# Patient Record
Sex: Female | Born: 1965 | Race: White | Hispanic: No | Marital: Married | Smoking: Never smoker
Health system: Southern US, Community
[De-identification: ages and names within clinical notes are randomized; demographics above are authoritative.]

## PROBLEM LIST (undated history)

## (undated) DIAGNOSIS — S42022A Displaced fracture of shaft of left clavicle, initial encounter for closed fracture: Secondary | ICD-10-CM

## (undated) DIAGNOSIS — H04123 Dry eye syndrome of bilateral lacrimal glands: Secondary | ICD-10-CM

## (undated) DIAGNOSIS — D649 Anemia, unspecified: Secondary | ICD-10-CM

## (undated) DIAGNOSIS — I499 Cardiac arrhythmia, unspecified: Secondary | ICD-10-CM

## (undated) DIAGNOSIS — K219 Gastro-esophageal reflux disease without esophagitis: Secondary | ICD-10-CM

## (undated) DIAGNOSIS — E039 Hypothyroidism, unspecified: Secondary | ICD-10-CM

## (undated) DIAGNOSIS — R51 Headache: Secondary | ICD-10-CM

## (undated) DIAGNOSIS — R519 Headache, unspecified: Secondary | ICD-10-CM

## (undated) DIAGNOSIS — K922 Gastrointestinal hemorrhage, unspecified: Secondary | ICD-10-CM

## (undated) DIAGNOSIS — N879 Dysplasia of cervix uteri, unspecified: Secondary | ICD-10-CM

## (undated) HISTORY — PX: REFRACTIVE SURGERY: SHX103

## (undated) HISTORY — DX: Dysplasia of cervix uteri, unspecified: N87.9

## (undated) HISTORY — PX: LIPOSUCTION: SHX10

## (undated) HISTORY — PX: COSMETIC SURGERY: SHX468

## (undated) HISTORY — PX: CATARACT EXTRACTION: SUR2

## (undated) HISTORY — PX: UPPER GI ENDOSCOPY: SHX6162

---

## 1990-08-04 HISTORY — PX: CERVICAL BIOPSY  W/ LOOP ELECTRODE EXCISION: SUR135

## 1998-11-13 ENCOUNTER — Other Ambulatory Visit: Admission: RE | Admit: 1998-11-13 | Discharge: 1998-11-13 | Payer: Self-pay | Admitting: Gynecology

## 2001-01-14 ENCOUNTER — Other Ambulatory Visit: Admission: RE | Admit: 2001-01-14 | Discharge: 2001-01-14 | Payer: Self-pay | Admitting: Gynecology

## 2002-03-01 ENCOUNTER — Other Ambulatory Visit: Admission: RE | Admit: 2002-03-01 | Discharge: 2002-03-01 | Payer: Self-pay | Admitting: Gynecology

## 2002-09-05 ENCOUNTER — Ambulatory Visit (HOSPITAL_COMMUNITY): Admission: RE | Admit: 2002-09-05 | Discharge: 2002-09-05 | Payer: Self-pay | Admitting: Orthopedic Surgery

## 2002-09-05 ENCOUNTER — Encounter: Payer: Self-pay | Admitting: Orthopedic Surgery

## 2003-02-14 ENCOUNTER — Other Ambulatory Visit: Admission: RE | Admit: 2003-02-14 | Discharge: 2003-02-14 | Payer: Self-pay | Admitting: Gynecology

## 2003-09-08 ENCOUNTER — Encounter: Admission: RE | Admit: 2003-09-08 | Discharge: 2003-09-08 | Payer: Self-pay | Admitting: Internal Medicine

## 2003-12-08 ENCOUNTER — Ambulatory Visit (HOSPITAL_COMMUNITY): Admission: RE | Admit: 2003-12-08 | Discharge: 2003-12-08 | Payer: Self-pay | Admitting: Gynecology

## 2005-04-15 ENCOUNTER — Other Ambulatory Visit: Admission: RE | Admit: 2005-04-15 | Discharge: 2005-04-15 | Payer: Self-pay | Admitting: Gynecology

## 2005-10-16 ENCOUNTER — Other Ambulatory Visit: Admission: RE | Admit: 2005-10-16 | Discharge: 2005-10-16 | Payer: Self-pay | Admitting: Gynecology

## 2006-02-13 ENCOUNTER — Encounter: Admission: RE | Admit: 2006-02-13 | Discharge: 2006-02-13 | Payer: Self-pay | Admitting: Family Medicine

## 2006-02-20 ENCOUNTER — Encounter: Admission: RE | Admit: 2006-02-20 | Discharge: 2006-02-20 | Payer: Self-pay | Admitting: Internal Medicine

## 2006-04-01 ENCOUNTER — Encounter: Admission: RE | Admit: 2006-04-01 | Discharge: 2006-04-01 | Payer: Self-pay | Admitting: Internal Medicine

## 2006-04-29 ENCOUNTER — Other Ambulatory Visit: Admission: RE | Admit: 2006-04-29 | Discharge: 2006-04-29 | Payer: Self-pay | Admitting: Gynecology

## 2007-05-12 ENCOUNTER — Other Ambulatory Visit: Admission: RE | Admit: 2007-05-12 | Discharge: 2007-05-12 | Payer: Self-pay | Admitting: Gynecology

## 2007-06-10 ENCOUNTER — Encounter: Payer: Self-pay | Admitting: Gynecology

## 2007-06-10 ENCOUNTER — Ambulatory Visit (HOSPITAL_BASED_OUTPATIENT_CLINIC_OR_DEPARTMENT_OTHER): Admission: RE | Admit: 2007-06-10 | Discharge: 2007-06-10 | Payer: Self-pay | Admitting: Gynecology

## 2008-06-02 ENCOUNTER — Ambulatory Visit: Payer: Self-pay | Admitting: Gynecology

## 2008-06-02 ENCOUNTER — Encounter: Payer: Self-pay | Admitting: Gynecology

## 2008-06-02 ENCOUNTER — Other Ambulatory Visit: Admission: RE | Admit: 2008-06-02 | Discharge: 2008-06-02 | Payer: Self-pay | Admitting: Gynecology

## 2009-06-18 ENCOUNTER — Other Ambulatory Visit: Admission: RE | Admit: 2009-06-18 | Discharge: 2009-06-18 | Payer: Self-pay | Admitting: Gynecology

## 2009-06-18 ENCOUNTER — Encounter: Payer: Self-pay | Admitting: Gynecology

## 2009-06-18 ENCOUNTER — Ambulatory Visit: Payer: Self-pay | Admitting: Gynecology

## 2009-07-17 ENCOUNTER — Ambulatory Visit: Payer: Self-pay | Admitting: Gynecology

## 2009-08-17 ENCOUNTER — Ambulatory Visit: Payer: Self-pay | Admitting: Gynecology

## 2009-08-23 ENCOUNTER — Ambulatory Visit (HOSPITAL_BASED_OUTPATIENT_CLINIC_OR_DEPARTMENT_OTHER): Admission: RE | Admit: 2009-08-23 | Discharge: 2009-08-23 | Payer: Self-pay | Admitting: Gynecology

## 2009-08-23 ENCOUNTER — Ambulatory Visit: Payer: Self-pay | Admitting: Gynecology

## 2009-09-06 ENCOUNTER — Ambulatory Visit: Payer: Self-pay | Admitting: Gynecology

## 2010-06-26 ENCOUNTER — Ambulatory Visit: Payer: Self-pay | Admitting: Gynecology

## 2010-06-26 ENCOUNTER — Other Ambulatory Visit: Admission: RE | Admit: 2010-06-26 | Discharge: 2010-06-26 | Payer: Self-pay | Admitting: Gynecology

## 2010-12-17 NOTE — H&P (Signed)
NAME:  Brittany Moss, Brittany Moss               ACCOUNT NO.:  [id number]   MEDICAL RECORD NO.:  [id number]          PATIENT TYPE:  AMB   LOCATION:  [facility]                         FACILITY:  [facility]   PHYSICIAN:  Timothy P. Fontaine, M.D.DATE OF BIRTH:  Sep 26, 1965   DATE OF ADMISSION:  06/10/2007  DATE OF DISCHARGE:                              HISTORY & PHYSICAL   CHIEF COMPLAINT:  Menorrhagia, endometrial polyps.   HISTORY OF PRESENT ILLNESS:  A 45 year old G3, P0, AB3 female that  presented for evaluation of menorrhagia.  Sonohystogram performed shows  two polypoid endometrial defects.  She is admitted at this time for  hysteroscopy, polypectomy, D&C.   PAST MEDICAL HISTORY:  Significant for  1. Depression.  2. Anxiety.  3. Hypothyroidism.   CURRENT MEDICATIONS:  1. Zoloft 50 mg daily.  2. Synthroid, dose per medicine reconciliation sheet.  3. Multivitamin.   PAST SURGICAL HISTORY:  Liposuction.   ALLERGIES:  No medications.   REVIEW OF SYSTEMS:  Noncontributory.   FAMILY HISTORY:  Noncontributory.   SOCIAL HISTORY:  Noncontributory.   PHYSICAL EXAMINATION ON ADMISSION:  VITAL SIGNS:  Afebrile, vital signs  are stable.  HEENT:  Normal.  LUNGS:  Clear.  CARDIAC:  Regular rate.  No rubs, murmurs or gallops.  ABDOMINAL:  Exam benign.  PELVIC:  External BUS, vagina normal.  Cervix normal.  Uterus normal  size, midline and mobile, nontender.  Adnexa without masses or  tenderness.   ASSESSMENT:  A 45 year old with menorrhagia.  Sonohystogram shows  endometrial defects consistent with polyps for hysteroscopy dilatation  and curettage, polypectomy.  I reviewed the proposed surgery with her  and her husband, expected intraoperative and postoperative courses, the  instrumentation, use of the resectoscope, distended medium, dilatation  and curettage.  The acute risks of the procedure were reviewed to  include infection, prolonged antibiotics, hemorrhage necessitating  transfusion and  risks of transfusion, uterine perforation, including  damage to internal organs, bowel, bladder, ureters, vessels and nerves  necessitating major exploratory reparative surgeries, future reparative  surgeries, bowel resection, ostomy formation was all discussed,  understood and accepted.  The possible pathology results were discussed  and various scenarios reviewed.  She understands there are no guarantees  as far as menorrhagia relief, her menorrhagia may continue to worsen or  change following the procedure, all of which she understands and  accepts.  The patient's questions were answered to her satisfaction.  She is ready to proceed with surgery.      Timothy P. Fontaine, M.D.  Electronically Signed     TPF/MEDQ  D:  06/04/2007  T:  06/06/2007  Job:  [id number]

## 2010-12-17 NOTE — Op Note (Signed)
NAME:  Brittany Moss, Brittany Moss               ACCOUNT NO.:  [id number]   MEDICAL RECORD NO.:  [id number]          PATIENT TYPE:  AMB   LOCATION:  [facility]                         FACILITY:  [facility]   PHYSICIAN:  Timothy P. Fontaine, M.D.DATE OF BIRTH:  01-10-1966   DATE OF PROCEDURE:  06/10/2007  DATE OF DISCHARGE:                               OPERATIVE REPORT   PREOPERATIVE DIAGNOSES:  Menorrhagia, endometrial polyps.   POSTOPERATIVE DIAGNOSES:  Menorrhagia, endometrial polyps.   PROCEDURE:  Hysteroscopy D&C.   SURGEON:  Timothy P. Fontaine, M.D.   ANESTHETIC:  General with 1% lidocaine paracervical block.   SPECIMEN:  Endometrial curetting.   COMPLICATIONS:  None   ESTIMATED BLOOD LOSS:  Minimal.   SORBITOL DISCREPANCY:  50 mL.   FINDINGS:  EUA:  External BUS, vagina normal.  Cervix normal.  Uterus  normal size, anteverted, midline and mobile.  Adnexa without masses.  Hysteroscopic with multiple polypoid fragments initially obscuring  cavity.  Status post D&C cavity was normal with adequate hysteroscopy.  No remaining polypoid fragments.   PROCEDURE:  The patient was taken to the operating room, underwent  general anesthesia, was placed in the dorsal lithotomy position,  received a perineal vaginal preparation with Betadine solution.  Bladder  emptied with in-and-out Foley catheterization.  EUA performed.  The  patient was draped in usual fashion.  Cervix was visualized with a  speculum.  Single-toothed tenaculum anterior lip stabilization.  Paracervical block using 1% lidocaine was placed, a total of 10 mL.  The  cervix was gently dilated to admit the operative hysteroscope, and  hysteroscopy was performed.  There was multiple polypoid fragments  filling the cavity, and a sharp curettage was performed to empty the  cavity.  Rehysteroscopy showed an empty cavity.  No remaining polypoid  areas, and the hysteroscopy was noted to be adequate and normal with  good distention, no evidence  of perforation.  The instruments were then  all removed.  The cervix showed adequate hemostasis.  Speculum removed.  The patient placed in the supine position, awakened without difficulty  and taken to recovery room in good condition, having tolerated the  procedure well.     Timothy P. Fontaine, M.D.  Electronically Signed    TPF/MEDQ  D:  06/10/2007  T:  06/10/2007  Job:  [id number]

## 2011-06-06 ENCOUNTER — Encounter: Payer: Self-pay | Admitting: Gynecology

## 2012-11-22 ENCOUNTER — Encounter: Payer: Self-pay | Admitting: Gynecology

## 2013-02-17 ENCOUNTER — Other Ambulatory Visit (HOSPITAL_COMMUNITY)
Admission: RE | Admit: 2013-02-17 | Discharge: 2013-02-17 | Disposition: A | Discharge status: Home / Self Care | Payer: 59 | Source: Ambulatory Visit | Attending: Gynecology | Admitting: Gynecology

## 2013-02-17 ENCOUNTER — Ambulatory Visit (INDEPENDENT_AMBULATORY_CARE_PROVIDER_SITE_OTHER): Payer: Managed Care, Other (non HMO) | Admitting: Gynecology

## 2013-02-17 ENCOUNTER — Encounter: Payer: Self-pay | Admitting: Gynecology

## 2013-02-17 VITALS — BP 114/68 | Ht 64.0 in | Wt 144.0 lb

## 2013-02-17 DIAGNOSIS — Z01419 Encounter for gynecological examination (general) (routine) without abnormal findings: Secondary | ICD-10-CM | POA: Insufficient documentation

## 2013-02-17 DIAGNOSIS — Z1151 Encounter for screening for human papillomavirus (HPV): Secondary | ICD-10-CM | POA: Insufficient documentation

## 2013-02-17 DIAGNOSIS — N926 Irregular menstruation, unspecified: Secondary | ICD-10-CM

## 2013-02-17 DIAGNOSIS — N951 Menopausal and female climacteric states: Secondary | ICD-10-CM

## 2013-02-17 NOTE — Patient Instructions (Signed)
Follow up for ultrasound as scheduled 

## 2013-02-17 NOTE — Progress Notes (Signed)
Fajr Marut 1966/07/14 [id number]        47 y.o.  G3P0030 for annual exam.  Has not been in for several years. Several issues noted below.  Past medical history,surgical history, medications, allergies, family history and social history were all reviewed and documented in the EPIC chart.  ROS:  Performed and pertinent positives and negatives are included in the history, assessment and plan .  Exam: Kim assistant Filed Vitals:   02/17/13 1607  BP: 114/68  Height: 5\' 4"  (1.626 m)  Weight: 144 lb (65.318 kg)   General appearance  Normal Skin grossly normal Head/Neck normal with no cervical or supraclavicular adenopathy thyroid normal Lungs  clear Cardiac RR, without RMG Abdominal  soft, nontender, without masses, organomegaly or hernia Breasts  examined lying and sitting without masses, retractions, discharge or axillary adenopathy. Pelvic  Ext/BUS/vagina  normal   Cervix  normal   Uterus  anteverted, normal size, shape and contour, midline and mobile nontender   Adnexa  Without masses or tenderness    Anus and perineum  normal   Rectovaginal  normal sphincter tone without palpated masses or tenderness.    Assessment/Plan:  47 y.o. G65P0030 female for annual exam.   1. Irregular menses/menopausal symptoms. Long history of irregular menses. Evaluation in 2011 showed FSH of 81. She continues to have menses anywhere from once a month to skip up to 50 days. She is having night sweats as well as memory issues. We will recheck FSH TSH today. Recommend sonohysterogram due to the irregular bleeding history and past history of endometrial polyps. Discussed options to include HRT versus low-dose oral contraceptives. She still is having menses low dose oral contraceptives may be a better choice for menstrual regulation and addressing the low estrogen level. Every other to every third month withdrawal options also reviewed. I reviewed the whole issue of HRT to include the WHI study with increased  risk of stroke heart attack DVT and breast cancer. Increased risk of thrombosis with oral contraceptives also discussed particularly with advancing age. She's not been followed for any vascular disease such as diabetes or hypertension and she has never smoked. Will rediscuss after lab work and ultrasound. 2. Pap smear 2011. Pap/HPV done today. History of LEEP 1995. Those records are off site but she has normal Pap smears since then through 2011. Assuming this Pap smear returns normal in plan continued screening at a less frequent interval per current screening guidelines. 3. Mammography 2014. Continue with annual mammography. SBE monthly reviewed. 4. Health maintenance. Patient has all of her routine blood work done through her primary physician's office. Will followup for her hormone levels as well as sonohysterogram and then we'll go from there.  Note: This document was prepared with digital dictation and possible smart phrase technology. Any transcriptional errors that result from this process are unintentional.   Dara Lords MD, 4:40 PM 02/17/2013

## 2013-02-18 LAB — FOLLICLE STIMULATING HORMONE: FSH: 28.2 m[IU]/mL

## 2013-03-01 ENCOUNTER — Other Ambulatory Visit: Payer: Self-pay | Admitting: Gynecology

## 2013-03-01 DIAGNOSIS — N926 Irregular menstruation, unspecified: Secondary | ICD-10-CM

## 2013-03-02 ENCOUNTER — Encounter: Payer: Self-pay | Admitting: Gynecology

## 2013-03-02 ENCOUNTER — Ambulatory Visit (INDEPENDENT_AMBULATORY_CARE_PROVIDER_SITE_OTHER): Payer: Managed Care, Other (non HMO) | Admitting: Gynecology

## 2013-03-02 ENCOUNTER — Ambulatory Visit (INDEPENDENT_AMBULATORY_CARE_PROVIDER_SITE_OTHER): Payer: Managed Care, Other (non HMO)

## 2013-03-02 DIAGNOSIS — Q51810 Arcuate uterus: Secondary | ICD-10-CM

## 2013-03-02 DIAGNOSIS — N831 Corpus luteum cyst of ovary, unspecified side: Secondary | ICD-10-CM

## 2013-03-02 DIAGNOSIS — N920 Excessive and frequent menstruation with regular cycle: Secondary | ICD-10-CM

## 2013-03-02 DIAGNOSIS — N83209 Unspecified ovarian cyst, unspecified side: Secondary | ICD-10-CM

## 2013-03-02 DIAGNOSIS — D251 Intramural leiomyoma of uterus: Secondary | ICD-10-CM

## 2013-03-02 DIAGNOSIS — N926 Irregular menstruation, unspecified: Secondary | ICD-10-CM

## 2013-03-02 DIAGNOSIS — D259 Leiomyoma of uterus, unspecified: Secondary | ICD-10-CM

## 2013-03-02 DIAGNOSIS — N839 Noninflammatory disorder of ovary, fallopian tube and broad ligament, unspecified: Secondary | ICD-10-CM

## 2013-03-02 MED ORDER — NORETHINDRONE ACET-ETHINYL EST 1-20 MG-MCG PO TABS: 1.0000 | ORAL_TABLET | Freq: Every day | ORAL | Status: DC

## 2013-03-02 NOTE — Patient Instructions (Signed)
Start on birth control pills as we discussed. Followup for repeat ultrasound in 3 months.

## 2013-03-02 NOTE — Progress Notes (Signed)
Patient presents for sonohysterogram due to irregular bleeding. FSH returned to 28. TSH was mildly elevated also.  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 Endometrial echo 5.9 mm. Right and left ovaries with cystic changes consistent with physiologic change. There is some debris in the left ovarian cyst questionable endometrioma versus hemorrhagic cyst.  Sonohysterogram was performed, sterile technique, easy catheter introduction, somewhat suboptimal distention with quick evacuation of fluid after injection. Ragged endometrial pattern but no clear polyps or submucous myomas. Endometrial sample taken. Patient tolerated well.  Assessment and plan: 1. Irregular menses. Marginal FSH. Again reviewed options and after a lengthy discussion I recommend low-dose oral contraceptives for menstrual regulation. She has not been followed for any medical problems and never smoked. Risks of thrombosis to include stroke heart attack DVT reviewed and accepted. We'll go ahead and start now start now with next menses. Options for every other month withdrawal also discussed. Offbrand labeling reviewed. 2. TSH mildly elevated. Patient has appointment to see her primary for Synthroid adjustment. 3. Ovarian cystic changes. Suspect physiologic. Possible endometrioma reviewed. Recommended ultrasound in 3 months for recheck and she agrees to schedule.

## 2013-03-04 ENCOUNTER — Encounter: Payer: Self-pay | Admitting: Gynecology

## 2013-03-07 ENCOUNTER — Encounter: Payer: Self-pay | Admitting: Gynecology

## 2013-06-01 ENCOUNTER — Encounter: Payer: Self-pay | Admitting: Gynecology

## 2013-06-01 ENCOUNTER — Ambulatory Visit (INDEPENDENT_AMBULATORY_CARE_PROVIDER_SITE_OTHER): Payer: Managed Care, Other (non HMO) | Admitting: Gynecology

## 2013-06-01 ENCOUNTER — Ambulatory Visit (INDEPENDENT_AMBULATORY_CARE_PROVIDER_SITE_OTHER): Payer: Managed Care, Other (non HMO)

## 2013-06-01 DIAGNOSIS — N951 Menopausal and female climacteric states: Secondary | ICD-10-CM

## 2013-06-01 DIAGNOSIS — N8300 Follicular cyst of ovary, unspecified side: Secondary | ICD-10-CM

## 2013-06-01 DIAGNOSIS — N83209 Unspecified ovarian cyst, unspecified side: Secondary | ICD-10-CM

## 2013-06-01 NOTE — Patient Instructions (Signed)
Call me if her hot flashes continue. Otherwise followup when you're due for your annual exam.

## 2013-06-01 NOTE — Progress Notes (Signed)
Patient follows up having had ultrasound due to irregular bleeding in July where she had generous ovarian cysts bilaterally and was recommended to followup in 3 months for repeat ultrasound. Also was started on Loestrin 120 equivalents due to hot flashes and some irregular bleeding. Her irregular bleeding has resolved but she still is having some hot flashes after 2 packs of the pills.  Ultrasound shows uterus normal size with small intramural myoma. Endometrial echo 3.6 mm. Right and left ovaries normal with physiologic changes but resolution of the prior cystic changes. Cul-de-sac negative.  Assessment and plan: 1. Bilateral ovarian cysts, resolved. Plan expectant management. 2. Hot flushes. She is having her thyroid adjusted by her other physician. We'll go 1 more month on the Loestrin 120 to see if she continues to have hot flashes. If she continues she will call and we will try increasing her estrogen and go to a little higher dose pill. Assuming the results and she will followup as she is due for her annual exam

## 2013-06-22 ENCOUNTER — Other Ambulatory Visit: Payer: Self-pay | Admitting: Gastroenterology

## 2014-01-20 ENCOUNTER — Other Ambulatory Visit: Payer: Self-pay | Admitting: Gynecology

## 2014-02-22 ENCOUNTER — Encounter: Payer: Self-pay | Admitting: Gynecology

## 2014-03-27 ENCOUNTER — Ambulatory Visit (INDEPENDENT_AMBULATORY_CARE_PROVIDER_SITE_OTHER): Payer: BC Managed Care – PPO | Admitting: Gynecology

## 2014-03-27 ENCOUNTER — Encounter: Payer: Self-pay | Admitting: Gynecology

## 2014-03-27 VITALS — BP 110/70 | Ht 64.0 in | Wt 151.0 lb

## 2014-03-27 DIAGNOSIS — Z01419 Encounter for gynecological examination (general) (routine) without abnormal findings: Secondary | ICD-10-CM

## 2014-03-27 DIAGNOSIS — N926 Irregular menstruation, unspecified: Secondary | ICD-10-CM

## 2014-03-27 NOTE — Patient Instructions (Signed)
Followup for hormone results.  You may obtain a copy of any labs that were done today by logging onto MyChart as outlined in the instructions provided with your AVS (after visit summary). The office will not call with normal lab results but certainly if there are any significant abnormalities then we will contact you.   Health Maintenance, Female A healthy lifestyle and preventative care can promote health and wellness.  Maintain regular health, dental, and eye exams.  Eat a healthy diet. Foods like vegetables, fruits, whole grains, low-fat dairy products, and lean protein foods contain the nutrients you need without too many calories. Decrease your intake of foods high in solid fats, added sugars, and salt. Get information about a proper diet from your caregiver, if necessary.  Regular physical exercise is one of the most important things you can do for your health. Most adults should get at least 150 minutes of moderate-intensity exercise (any activity that increases your heart rate and causes you to sweat) each week. In addition, most adults need muscle-strengthening exercises on 2 or more days a week.   Maintain a healthy weight. The body mass index (BMI) is a screening tool to identify possible weight problems. It provides an estimate of body fat based on height and weight. Your caregiver can help determine your BMI, and can help you achieve or maintain a healthy weight. For adults 20 years and older:  A BMI below 18.5 is considered underweight.  A BMI of 18.5 to 24.9 is normal.  A BMI of 25 to 29.9 is considered overweight.  A BMI of 30 and above is considered obese.  Maintain normal blood lipids and cholesterol by exercising and minimizing your intake of saturated fat. Eat a balanced diet with plenty of fruits and vegetables. Blood tests for lipids and cholesterol should begin at age 38 and be repeated every 5 years. If your lipid or cholesterol levels are high, you are over 50, or you  are a high risk for heart disease, you may need your cholesterol levels checked more frequently.Ongoing high lipid and cholesterol levels should be treated with medicines if diet and exercise are not effective.  If you smoke, find out from your caregiver how to quit. If you do not use tobacco, do not start.  Lung cancer screening is recommended for adults aged 62 80 years who are at high risk for developing lung cancer because of a history of smoking. Yearly low-dose computed tomography (CT) is recommended for people who have at least a 30-pack-year history of smoking and are a current smoker or have quit within the past 15 years. A pack year of smoking is smoking an average of 1 pack of cigarettes a day for 1 year (for example: 1 pack a day for 30 years or 2 packs a day for 15 years). Yearly screening should continue until the smoker has stopped smoking for at least 15 years. Yearly screening should also be stopped for people who develop a health problem that would prevent them from having lung cancer treatment.  If you are pregnant, do not drink alcohol. If you are breastfeeding, be very cautious about drinking alcohol. If you are not pregnant and choose to drink alcohol, do not exceed 1 drink per day. One drink is considered to be 12 ounces (355 mL) of beer, 5 ounces (148 mL) of wine, or 1.5 ounces (44 mL) of liquor.  Avoid use of street drugs. Do not share needles with anyone. Ask for help if you need  support or instructions about stopping the use of drugs.  High blood pressure causes heart disease and increases the risk of stroke. Blood pressure should be checked at least every 1 to 2 years. Ongoing high blood pressure should be treated with medicines, if weight loss and exercise are not effective.  If you are 42 to 49 years old, ask your caregiver if you should take aspirin to prevent strokes.  Diabetes screening involves taking a blood sample to check your fasting blood sugar level. This should  be done once every 3 years, after age 12, if you are within normal weight and without risk factors for diabetes. Testing should be considered at a younger age or be carried out more frequently if you are overweight and have at least 1 risk factor for diabetes.  Breast cancer screening is essential preventative care for women. You should practice "breast self-awareness." This means understanding the normal appearance and feel of your breasts and may include breast self-examination. Any changes detected, no matter how small, should be reported to a caregiver. Women in their 64s and 30s should have a clinical breast exam (CBE) by a caregiver as part of a regular health exam every 1 to 3 years. After age 89, women should have a CBE every year. Starting at age 45, women should consider having a mammogram (breast X-ray) every year. Women who have a family history of breast cancer should talk to their caregiver about genetic screening. Women at a high risk of breast cancer should talk to their caregiver about having an MRI and a mammogram every year.  Breast cancer gene (BRCA)-related cancer risk assessment is recommended for women who have family members with BRCA-related cancers. BRCA-related cancers include breast, ovarian, tubal, and peritoneal cancers. Having family members with these cancers may be associated with an increased risk for harmful changes (mutations) in the breast cancer genes BRCA1 and BRCA2. Results of the assessment will determine the need for genetic counseling and BRCA1 and BRCA2 testing.  The Pap test is a screening test for cervical cancer. Women should have a Pap test starting at age 78. Between ages 28 and 73, Pap tests should be repeated every 2 years. Beginning at age 43, you should have a Pap test every 3 years as long as the past 3 Pap tests have been normal. If you had a hysterectomy for a problem that was not cancer or a condition that could lead to cancer, then you no longer need  Pap tests. If you are between ages 9 and 69, and you have had normal Pap tests going back 10 years, you no longer need Pap tests. If you have had past treatment for cervical cancer or a condition that could lead to cancer, you need Pap tests and screening for cancer for at least 20 years after your treatment. If Pap tests have been discontinued, risk factors (such as a new sexual partner) need to be reassessed to determine if screening should be resumed. Some women have medical problems that increase the chance of getting cervical cancer. In these cases, your caregiver may recommend more frequent screening and Pap tests.  The human papillomavirus (HPV) test is an additional test that may be used for cervical cancer screening. The HPV test looks for the virus that can cause the cell changes on the cervix. The cells collected during the Pap test can be tested for HPV. The HPV test could be used to screen women aged 76 years and older, and should be used  in women of any age who have unclear Pap test results. After the age of 53, women should have HPV testing at the same frequency as a Pap test.  Colorectal cancer can be detected and often prevented. Most routine colorectal cancer screening begins at the age of 67 and continues through age 15. However, your caregiver may recommend screening at an earlier age if you have risk factors for colon cancer. On a yearly basis, your caregiver may provide home test kits to check for hidden blood in the stool. Use of a small camera at the end of a tube, to directly examine the colon (sigmoidoscopy or colonoscopy), can detect the earliest forms of colorectal cancer. Talk to your caregiver about this at age 64, when routine screening begins. Direct examination of the colon should be repeated every 5 to 10 years through age 7, unless early forms of pre-cancerous polyps or small growths are found.  Hepatitis C blood testing is recommended for all people born from [birth year] through  1966 and any individual with known risks for hepatitis C.  Practice safe sex. Use condoms and avoid high-risk sexual practices to reduce the spread of sexually transmitted infections (STIs). Sexually active women aged 19 and younger should be checked for Chlamydia, which is a common sexually transmitted infection. Older women with new or multiple partners should also be tested for Chlamydia. Testing for other STIs is recommended if you are sexually active and at increased risk.  Osteoporosis is a disease in which the bones lose minerals and strength with aging. This can result in serious bone fractures. The risk of osteoporosis can be identified using a bone density scan. Women ages 49 and over and women at risk for fractures or osteoporosis should discuss screening with their caregivers. Ask your caregiver whether you should be taking a calcium supplement or vitamin D to reduce the rate of osteoporosis.  Menopause can be associated with physical symptoms and risks. Hormone replacement therapy is available to decrease symptoms and risks. You should talk to your caregiver about whether hormone replacement therapy is right for you.  Use sunscreen. Apply sunscreen liberally and repeatedly throughout the day. You should seek shade when your shadow is shorter than you. Protect yourself by wearing long sleeves, pants, a wide-brimmed hat, and sunglasses year round, whenever you are outdoors.  Notify your caregiver of new moles or changes in moles, especially if there is a change in shape or color. Also notify your caregiver if a mole is larger than the size of a pencil eraser.  Stay current with your immunizations. Document Released: 02/03/2011 Document Revised: 11/15/2012 Document Reviewed: 02/03/2011 [facility] Patient Information 2014 [place].

## 2014-03-27 NOTE — Progress Notes (Signed)
Brittany Moss [birth year] [id number]        48 y.o.  G3P0030 for annual exam.  Several issues noted below.  Past medical history,surgical history, problem list, medications, allergies, family history and social history were all reviewed and documented as reviewed in the EPIC chart.  ROS:  12 system ROS performed with pertinent positives and negatives included in the history, assessment and plan.   Additional significant findings :  None   Exam: Brittany Moss Vitals:   03/27/14 1051  BP: 110/70  Height: 5\' 4"  (1.626 m)  Weight: 151 lb (68.493 kg)   General appearance:  Normal affect, orientation and appearance. Skin: Grossly normal HEENT: Without gross lesions.  No cervical or supraclavicular adenopathy. Thyroid normal.  Lungs:  Clear without wheezing, rales or rhonchi Cardiac: RR, without RMG Abdominal:  Soft, nontender, without masses, guarding, rebound, organomegaly or hernia Breasts:  Examined lying and sitting without masses, retractions, discharge or axillary adenopathy. Pelvic:  Ext/BUS/vagina normal  Cervix normal  Uterus anteverted, normal size, shape and contour, midline and mobile nontender   Adnexa  Without masses or tenderness    Anus and perineum  Normal   Rectovaginal  Normal sphincter tone without palpated masses or tenderness.    Assessment/Plan:  48 y.o. G32P0030 female for annual exam.   1. Irregular menses. Patient's menses continue mildly irregular with she will go up to 7 weeks without menses. No prolonged or atypical bleeding between periods. Was evaluated with sonohysterogram 02/2013 which was normal with negative endometrial biopsy. [place] in the past has been up to 72. Last year [facility] was 28. Was started on low-dose oral contraceptives but discontinued after several months due to weight gain and continued night sweats. Recheck FSH now. Will rediscuss options with results but at this point patient is not interested in hormonal manipulation. 2. Pap smear/HPV  negative 2014. History of a LEEP in 1992, CIN-1 with clear margins. Plan repeat Pap smear at 3-5 year interval per current screening guidelines. 3. Mammography 02/2014. Continue with annual mammography. SBE monthly reviewed. 4. Health maintenance. Has routine blood work done through her primary physician's office. They are in the process of regulating her thyroid. Followup for [facility] otherwise annually. Sooner if any issues.  Note: This document was prepared with digital dictation and possible smart phrase technology. Any transcriptional errors that result from this process are unintentional.   [name] MD, 11:37 AM 03/27/2014

## 2014-03-28 LAB — URINALYSIS W MICROSCOPIC + REFLEX CULTURE
BACTERIA UA: NONE SEEN
Bilirubin Urine: NEGATIVE
CASTS: NONE SEEN
Crystals: NONE SEEN
Glucose, UA: NEGATIVE mg/dL
HGB URINE DIPSTICK: NEGATIVE
KETONES UR: NEGATIVE mg/dL
Leukocytes, UA: NEGATIVE
NITRITE: NEGATIVE
PH: 5.5 (ref 5.0–8.0)
Protein, ur: NEGATIVE mg/dL
Specific Gravity, Urine: 1.011 (ref 1.005–1.030)
Squamous Epithelial / LPF: NONE SEEN
Urobilinogen, UA: 0.2 mg/dL (ref 0.0–1.0)

## 2014-03-28 LAB — FOLLICLE STIMULATING HORMONE: FSH: 29.4 m[IU]/mL

## 2014-06-05 ENCOUNTER — Encounter: Payer: Self-pay | Admitting: Gynecology

## 2015-01-19 ENCOUNTER — Ambulatory Visit
Admission: RE | Admit: 2015-01-19 | Discharge: 2015-01-19 | Disposition: A | Discharge status: Home / Self Care | Payer: BLUE CROSS/BLUE SHIELD | Source: Ambulatory Visit | Attending: Internal Medicine | Admitting: Internal Medicine

## 2015-01-19 ENCOUNTER — Other Ambulatory Visit (HOSPITAL_COMMUNITY): Payer: Self-pay | Admitting: Internal Medicine

## 2015-01-19 ENCOUNTER — Other Ambulatory Visit: Payer: Self-pay | Admitting: Internal Medicine

## 2015-01-19 DIAGNOSIS — R1013 Epigastric pain: Secondary | ICD-10-CM

## 2015-01-24 ENCOUNTER — Other Ambulatory Visit (HOSPITAL_COMMUNITY): Payer: Self-pay | Admitting: Internal Medicine

## 2015-01-24 DIAGNOSIS — K824 Cholesterolosis of gallbladder: Secondary | ICD-10-CM

## 2015-01-24 DIAGNOSIS — R1013 Epigastric pain: Secondary | ICD-10-CM

## 2015-01-25 ENCOUNTER — Other Ambulatory Visit: Payer: Self-pay | Admitting: Internal Medicine

## 2015-01-25 DIAGNOSIS — G8929 Other chronic pain: Secondary | ICD-10-CM

## 2015-01-25 DIAGNOSIS — R109 Unspecified abdominal pain: Principal | ICD-10-CM

## 2015-01-26 ENCOUNTER — Ambulatory Visit
Admission: RE | Admit: 2015-01-26 | Discharge: 2015-01-26 | Disposition: A | Discharge status: Home / Self Care | Payer: BLUE CROSS/BLUE SHIELD | Source: Ambulatory Visit | Attending: Internal Medicine | Admitting: Internal Medicine

## 2015-01-26 DIAGNOSIS — G8929 Other chronic pain: Secondary | ICD-10-CM

## 2015-01-26 DIAGNOSIS — R109 Unspecified abdominal pain: Principal | ICD-10-CM

## 2015-01-26 MED ORDER — IOPAMIDOL (ISOVUE-300) INJECTION 61%
100.0000 mL | Freq: Once | INTRAVENOUS | Status: AC | PRN
  Administered 2015-01-26: 100 mL via INTRAVENOUS

## 2015-02-12 ENCOUNTER — Ambulatory Visit (HOSPITAL_COMMUNITY)
Admission: RE | Admit: 2015-02-12 | Discharge: 2015-02-12 | Disposition: A | Discharge status: Home / Self Care | Payer: BLUE CROSS/BLUE SHIELD | Source: Ambulatory Visit | Attending: Internal Medicine | Admitting: Internal Medicine

## 2015-02-12 DIAGNOSIS — K824 Cholesterolosis of gallbladder: Secondary | ICD-10-CM | POA: Diagnosis not present

## 2015-02-12 DIAGNOSIS — R1013 Epigastric pain: Secondary | ICD-10-CM | POA: Diagnosis present

## 2015-02-12 MED ORDER — TECHNETIUM TC 99M MEBROFENIN IV KIT
5.5000 | PACK | Freq: Once | INTRAVENOUS | Status: AC | PRN
  Administered 2015-02-12: 6 via INTRAVENOUS

## 2015-02-12 MED ORDER — SINCALIDE 5 MCG IJ SOLR
0.0200 ug/kg | Freq: Once | INTRAMUSCULAR | Status: AC
  Administered 2015-02-12: 1.4 ug via INTRAVENOUS

## 2015-02-22 ENCOUNTER — Other Ambulatory Visit: Payer: Self-pay | Admitting: Gastroenterology

## 2015-02-22 DIAGNOSIS — R1013 Epigastric pain: Secondary | ICD-10-CM

## 2015-02-28 ENCOUNTER — Ambulatory Visit
Admission: RE | Admit: 2015-02-28 | Discharge: 2015-02-28 | Disposition: A | Discharge status: Home / Self Care | Payer: BLUE CROSS/BLUE SHIELD | Source: Ambulatory Visit | Attending: Gastroenterology | Admitting: Gastroenterology

## 2015-02-28 DIAGNOSIS — R1013 Epigastric pain: Secondary | ICD-10-CM

## 2015-09-13 IMAGING — RF DG UGI W/ SMALL BOWEL HIGH DENSITY
19 of 24 series · 19 of 24 positions shown · non-contrast
Comparison: CT abdomen of 01/26/2015

CLINICAL DATA: Epigastric pain

EXAM:
UPPER GI SERIES WITH KUB
TECHNIQUE: After obtaining a scout radiograph a routine upper GI series was
performed using thin and high density barium.
FLUOROSCOPY TIME:  Radiation Exposure Index (as provided by the
fluoroscopic device): 71 Gy per sq cm
If the device does not provide the exposure index:
Fluoroscopy Time (in minutes and seconds):  2 minutes 24 second
Number of Acquired Images:

[Series 1: run · 1 of 1 slices shown (1 of 19)]
[im 1/1]
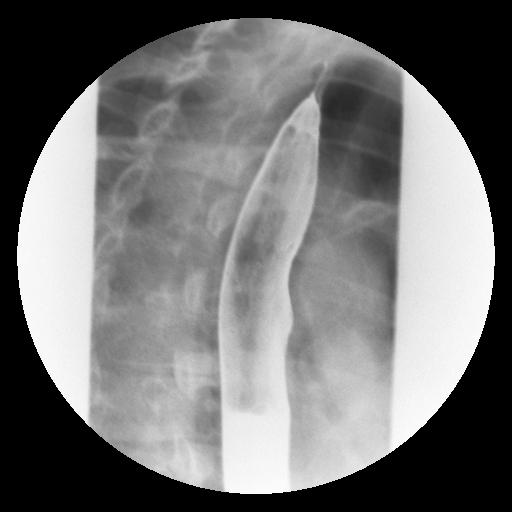

[Series 2: run · 1 of 1 slices shown (2 of 19)]
[im 1/1]
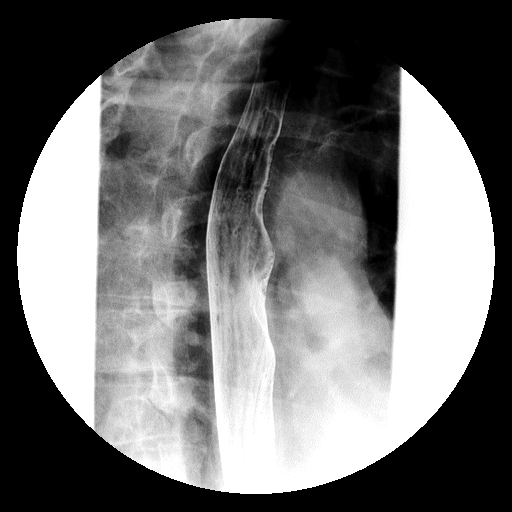

[Series 4: run · 1 of 1 slices shown (3 of 19)]
[im 1/1]
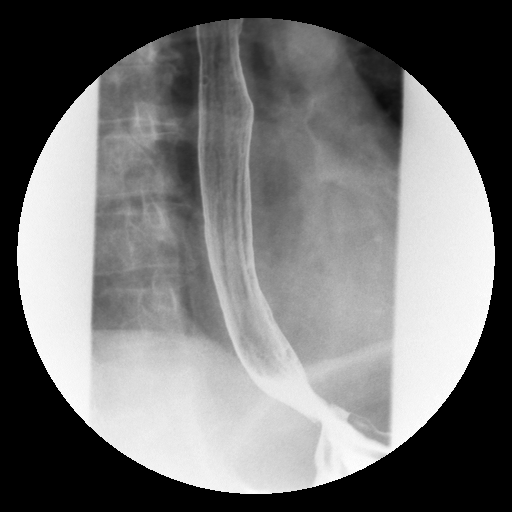

[Series 6: run · 1 of 1 slices shown (4 of 19)]
[im 1/1]
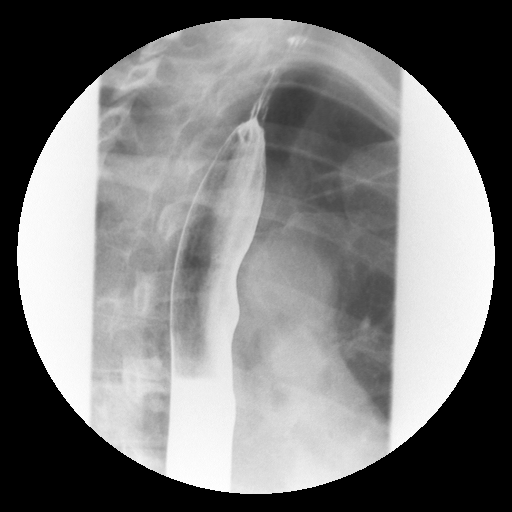

[Series 7: run · 1 of 1 slices shown (5 of 19)]
[im 1/1]
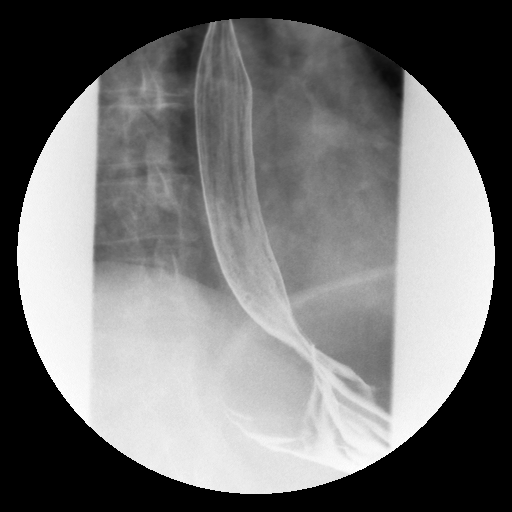

[Series 8: run · 1 of 1 slices shown (6 of 19)]
[im 1/1]
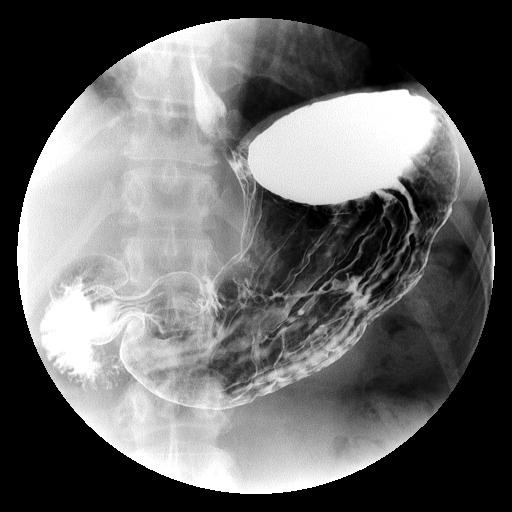

[Series 10: run · 1 of 1 slices shown (7 of 19)]
[im 1/1]
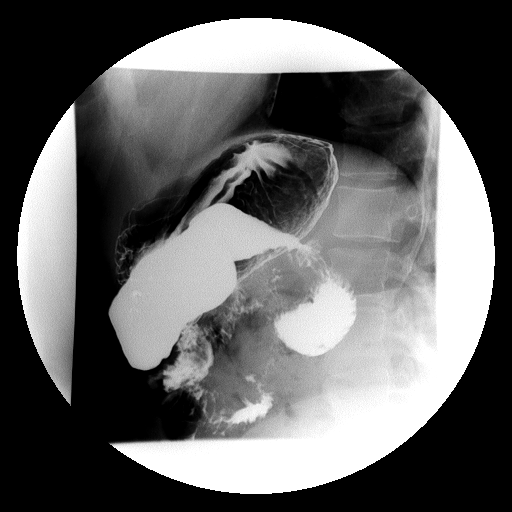

[Series 11: run · 1 of 1 slices shown (8 of 19)]
[im 1/1]
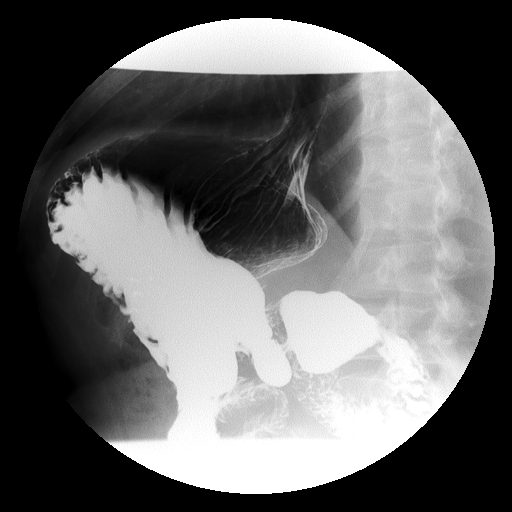

[Series 12: run · 1 of 1 slices shown (9 of 19)]
[im 1/1]
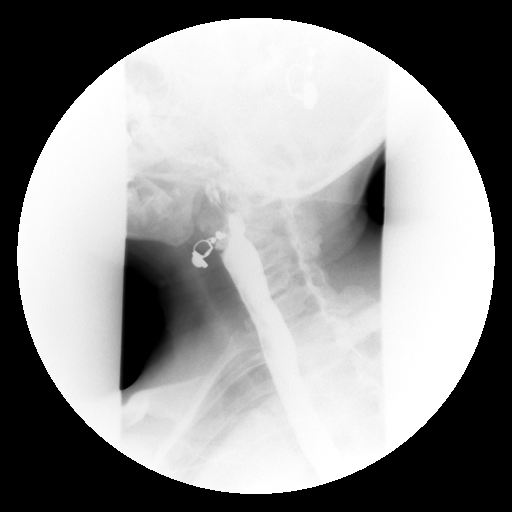

[Series 14: run · 1 of 1 slices shown (10 of 19)]
[im 1/1]
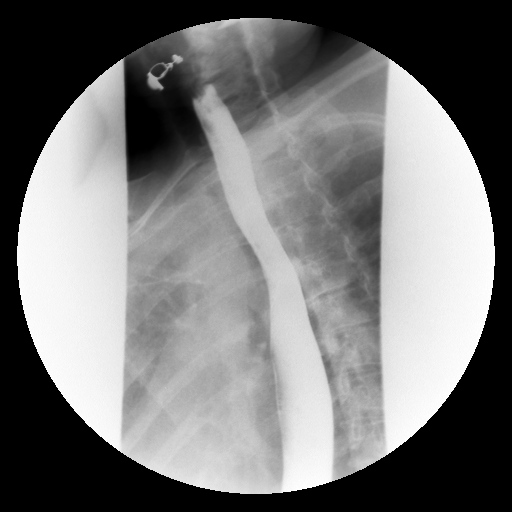

[Series 15: run · 1 of 1 slices shown (11 of 19)]
[im 1/1]
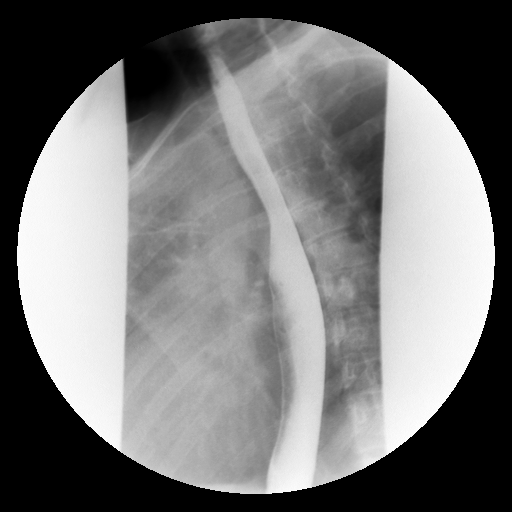

[Series 16: run · 1 of 1 slices shown (12 of 19)]
[im 1/1]
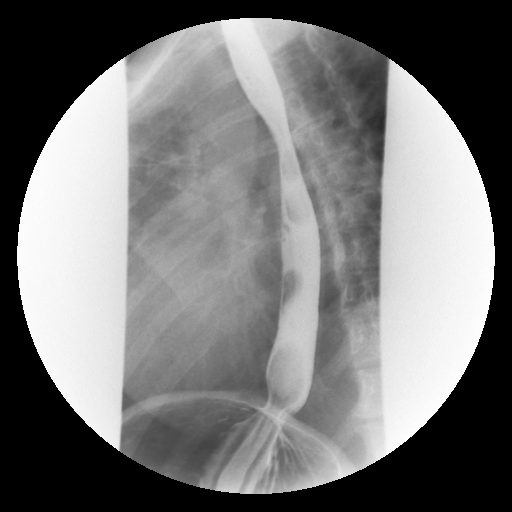

[Series 17: run · 1 of 1 slices shown (13 of 19)]
[im 1/1]
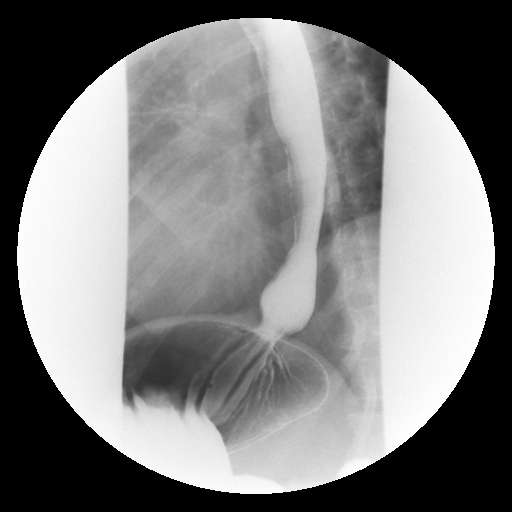

[Series 19: run · 1 of 1 slices shown (14 of 19)]
[im 1/1]
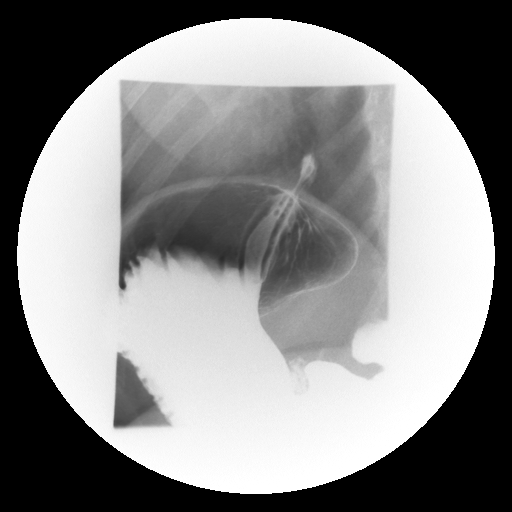

[Series 20: run · 1 of 1 slices shown (15 of 19)]
[im 1/1]
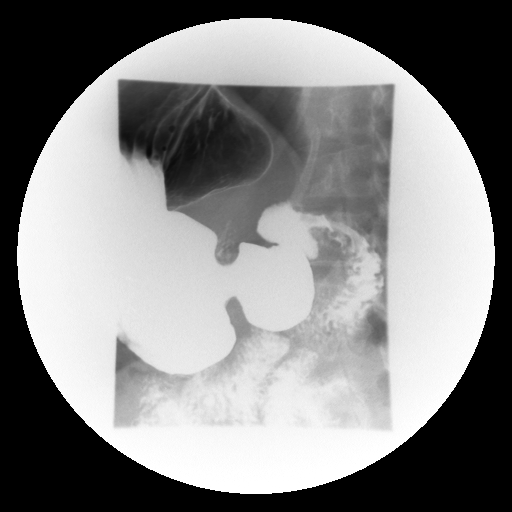

[Series 21: run · 1 of 1 slices shown (16 of 19)]
[im 1/1]
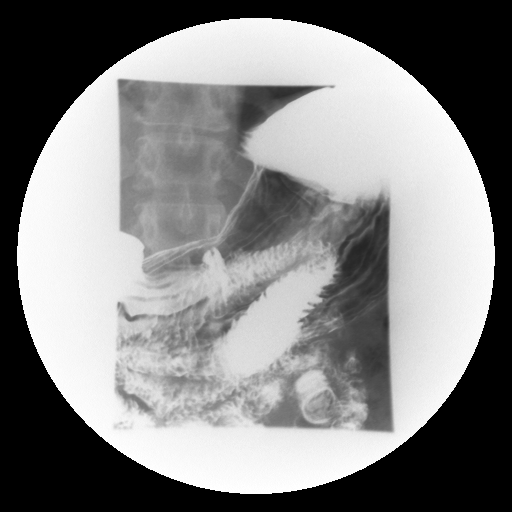

[Series 22: run · 1 of 1 slices shown (17 of 19)]
[im 1/1]
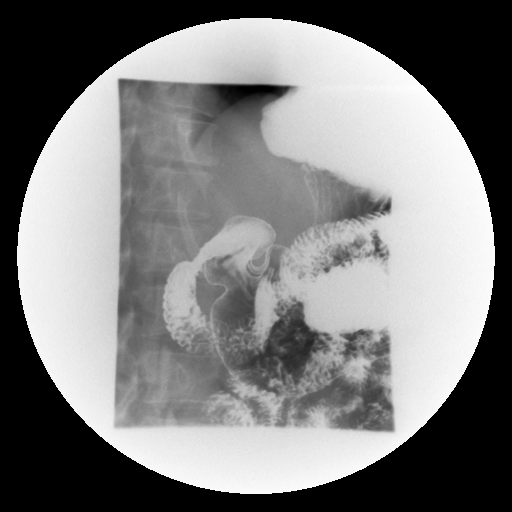

[Series 25: run · 1 of 1 slices shown (18 of 19)]
[im 1/1]
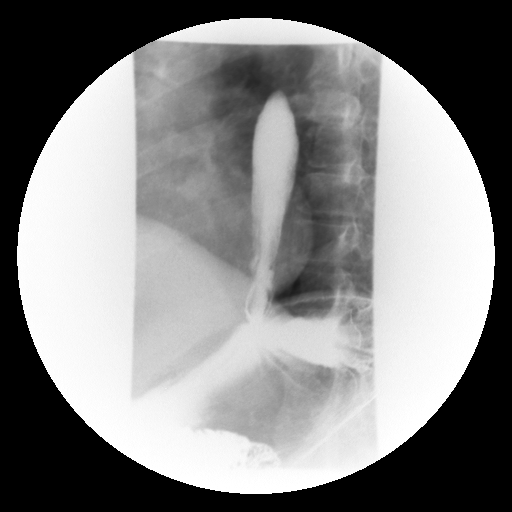

[Series 26: run · 1 of 1 slices shown (19 of 19)]
[im 1/1]
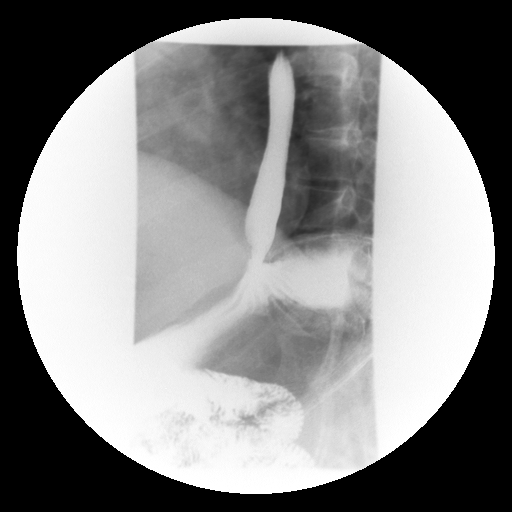

[19 of 24 positions shown; findings below may reference images not displayed]

FINDINGS: A preliminary film of the abdomen shows a nonspecific bowel gas
pattern. No opaque calculi are noted. A moderate amount of feces is
present throughout the colon.

A double-contrast upper GI was performed. The mucosa of the
esophagus is unremarkable. A single contrast study shows the
swallowing mechanism to be normal. Esophageal peristalsis is normal.
No hiatal hernia is seen. Moderate gastroesophageal reflux is
demonstrated. A barium pill was given at the end of the study which
passed into the stomach without delay.

The stomach is normal in contour and peristalsis. The duodenum bulb
fills well with air and barium with no ulceration noted, and the
duodenal loop is in normal position.

Additional barium was given orally and images of the small bowel
were obtained. The mucosal pattern of the small bowel is normal. No
mass, edema, or displacement of small bowel loops is seen. The
terminal ileum is well visualized and appears normal. The appendix
also appears normal.
IMPRESSION: 1. Moderate gastroesophageal reflux. No hiatal hernia. Barium pill
passes into the stomach without delay.
2. Negative small bowel follow-through. The terminal ileum appears
normal.

## 2015-10-03 ENCOUNTER — Encounter: Payer: Self-pay | Admitting: Gynecology

## 2015-11-30 ENCOUNTER — Other Ambulatory Visit: Payer: Self-pay | Admitting: Internal Medicine

## 2015-11-30 DIAGNOSIS — K824 Cholesterolosis of gallbladder: Secondary | ICD-10-CM

## 2015-12-10 ENCOUNTER — Ambulatory Visit
Admission: RE | Admit: 2015-12-10 | Discharge: 2015-12-10 | Disposition: A | Discharge status: Home / Self Care | Payer: Managed Care, Other (non HMO) | Source: Ambulatory Visit | Attending: Internal Medicine | Admitting: Internal Medicine

## 2015-12-10 DIAGNOSIS — K824 Cholesterolosis of gallbladder: Secondary | ICD-10-CM

## 2016-03-05 ENCOUNTER — Encounter: Payer: Self-pay | Admitting: Gynecology

## 2016-03-05 ENCOUNTER — Ambulatory Visit (INDEPENDENT_AMBULATORY_CARE_PROVIDER_SITE_OTHER): Payer: 59 | Admitting: Gynecology

## 2016-03-05 VITALS — BP 110/66 | Ht 64.0 in | Wt 153.0 lb

## 2016-03-05 DIAGNOSIS — N926 Irregular menstruation, unspecified: Secondary | ICD-10-CM

## 2016-03-05 DIAGNOSIS — Z01419 Encounter for gynecological examination (general) (routine) without abnormal findings: Secondary | ICD-10-CM

## 2016-03-05 NOTE — Patient Instructions (Signed)

## 2016-03-05 NOTE — Addendum Note (Signed)
Addended by: Nelva Nay on: 03/05/2016 04:14 PM   Modules accepted: Orders

## 2016-03-05 NOTE — Progress Notes (Signed)
    Brittany Moss Feb 22, 1966 [id number]        49 y.o.  G3P0030  for annual exam.  Several issues noted below.  Past medical history,surgical history, problem list, medications, allergies, family history and social history were all reviewed and documented as reviewed in the EPIC chart.  ROS:  Performed with pertinent positives and negatives included in the history, assessment and plan.   Additional significant findings :  None   Exam: Brittany Moss assistant Vitals:   03/05/16 1539  BP: 110/66  Weight: 153 lb (69.4 kg)  Height: 5\' 4"  (1.626 m)   Body mass index is 26.26 kg/m.  General appearance:  Normal affect, orientation and appearance. Skin: Grossly normal HEENT: Without gross lesions.  No cervical or supraclavicular adenopathy. Thyroid normal.  Lungs:  Clear without wheezing, rales or rhonchi Cardiac: RR, without RMG Abdominal:  Soft, nontender, without masses, guarding, rebound, organomegaly or hernia Breasts:  Examined lying and sitting without masses, retractions, discharge or axillary adenopathy. Pelvic:  Ext/BUS/Vagina normal  Cervix normal. Pap smear done  Uterus anteverted, normal size, shape and contour, midline and mobile nontender   Adnexa without masses or tenderness    Anus and perineum normal   Rectovaginal normal sphincter tone without palpated masses or tenderness.    Assessment/Plan:  50 y.o. G13P0030 female for annual exam with irregular menses.   1. Regular menses. Still having occasional menses going up to 3-4 months without. Was having hot flushes previously but these seem to be getting better. Beebe elevated in the past. Will keep menstrual calendar as long as less frequent but regular when occur will go more than 1 year without menses and then bleed she knows to follow up with me. Need for contraception until 1 year without menses. Options for HRT discussed. Risks versus benefits including cardiovascular bone health thrombosis of breast cancer all  discussed. Patient not interested at this point but will call if she changes her mind or develop significant symptoms. 2. Pap smear/HPV 2014. Pap smear done today. History of LEEP 1992. 3. Planning colonoscopy this coming year as already discussed with her primary physician and is going to call to arrange. 4. Mammography 10/2015. Continue with annual mammography when due. SBE monthly reviewed. 5. Health maintenance. No routine lab work done as this is done at her primary physician's office. Follow up 1 year, sooner as needed.   Brittany Auerbach MD, 3:59 PM 03/05/2016

## 2016-03-06 LAB — PAP IG W/ RFLX HPV ASCU

## 2016-06-04 DIAGNOSIS — K922 Gastrointestinal hemorrhage, unspecified: Secondary | ICD-10-CM

## 2016-06-04 DIAGNOSIS — D649 Anemia, unspecified: Secondary | ICD-10-CM

## 2016-06-04 HISTORY — DX: Gastrointestinal hemorrhage, unspecified: K92.2

## 2016-06-04 HISTORY — DX: Anemia, unspecified: D64.9

## 2016-07-14 ENCOUNTER — Ambulatory Visit
Admission: RE | Admit: 2016-07-14 | Discharge: 2016-07-14 | Disposition: A | Discharge status: Home / Self Care | Payer: Managed Care, Other (non HMO) | Source: Ambulatory Visit | Attending: Internal Medicine | Admitting: Internal Medicine

## 2016-07-14 ENCOUNTER — Other Ambulatory Visit: Payer: Self-pay | Admitting: Internal Medicine

## 2016-07-14 DIAGNOSIS — K922 Gastrointestinal hemorrhage, unspecified: Secondary | ICD-10-CM

## 2016-08-26 ENCOUNTER — Other Ambulatory Visit: Payer: Self-pay | Admitting: Gastroenterology

## 2016-10-21 ENCOUNTER — Encounter: Payer: Self-pay | Admitting: Gynecology

## 2016-11-21 ENCOUNTER — Encounter (HOSPITAL_COMMUNITY): Payer: Self-pay | Admitting: *Deleted

## 2016-11-24 ENCOUNTER — Ambulatory Visit (HOSPITAL_COMMUNITY): Payer: Managed Care, Other (non HMO) | Admitting: Anesthesiology

## 2016-11-24 ENCOUNTER — Encounter (HOSPITAL_COMMUNITY): Payer: Self-pay

## 2016-11-24 ENCOUNTER — Ambulatory Visit (HOSPITAL_COMMUNITY)
Admission: RE | Admit: 2016-11-24 | Discharge: 2016-11-24 | Disposition: A | Discharge status: Home / Self Care | Payer: Managed Care, Other (non HMO) | Source: Ambulatory Visit | Attending: Gastroenterology | Admitting: Gastroenterology

## 2016-11-24 ENCOUNTER — Encounter (HOSPITAL_COMMUNITY)
Admission: RE | Disposition: A | Discharge status: Home / Self Care | Payer: Self-pay | Source: Ambulatory Visit | Attending: Gastroenterology

## 2016-11-24 DIAGNOSIS — K589 Irritable bowel syndrome without diarrhea: Secondary | ICD-10-CM | POA: Diagnosis not present

## 2016-11-24 DIAGNOSIS — J45990 Exercise induced bronchospasm: Secondary | ICD-10-CM | POA: Diagnosis not present

## 2016-11-24 DIAGNOSIS — E039 Hypothyroidism, unspecified: Secondary | ICD-10-CM | POA: Insufficient documentation

## 2016-11-24 DIAGNOSIS — G43909 Migraine, unspecified, not intractable, without status migrainosus: Secondary | ICD-10-CM | POA: Diagnosis not present

## 2016-11-24 DIAGNOSIS — Z1211 Encounter for screening for malignant neoplasm of colon: Secondary | ICD-10-CM | POA: Diagnosis not present

## 2016-11-24 DIAGNOSIS — D120 Benign neoplasm of cecum: Secondary | ICD-10-CM | POA: Insufficient documentation

## 2016-11-24 DIAGNOSIS — Z8711 Personal history of peptic ulcer disease: Secondary | ICD-10-CM | POA: Insufficient documentation

## 2016-11-24 HISTORY — DX: Cardiac arrhythmia, unspecified: I49.9

## 2016-11-24 HISTORY — DX: Gastrointestinal hemorrhage, unspecified: K92.2

## 2016-11-24 HISTORY — DX: Hypothyroidism, unspecified: E03.9

## 2016-11-24 HISTORY — DX: Anemia, unspecified: D64.9

## 2016-11-24 HISTORY — PX: COLONOSCOPY WITH PROPOFOL: SHX5780

## 2016-11-24 HISTORY — DX: Headache, unspecified: R51.9

## 2016-11-24 HISTORY — DX: Gastro-esophageal reflux disease without esophagitis: K21.9

## 2016-11-24 HISTORY — DX: Headache: R51

## 2016-11-24 SURGERY — COLONOSCOPY WITH PROPOFOL
Anesthesia: Monitor Anesthesia Care

## 2016-11-24 MED ORDER — PROPOFOL 10 MG/ML IV BOLUS
INTRAVENOUS | Status: DC | PRN
  Administered 2016-11-24 (×4): 20 mg via INTRAVENOUS
  Administered 2016-11-24 (×2): 40 mg via INTRAVENOUS
  Administered 2016-11-24: 80 mg via INTRAVENOUS
  Administered 2016-11-24: 20 mg via INTRAVENOUS

## 2016-11-24 MED ORDER — SODIUM CHLORIDE 0.9 % IV SOLN: INTRAVENOUS | Status: DC

## 2016-11-24 MED ORDER — PROPOFOL 10 MG/ML IV BOLUS
INTRAVENOUS | Status: AC
  Filled 2016-11-24: qty 20

## 2016-11-24 MED ORDER — LACTATED RINGERS IV SOLN
INTRAVENOUS | Status: DC
  Administered 2016-11-24: 10:00:00 via INTRAVENOUS

## 2016-11-24 MED ORDER — PROPOFOL 500 MG/50ML IV EMUL
INTRAVENOUS | Status: DC | PRN
  Administered 2016-11-24: 100 ug/kg/min via INTRAVENOUS

## 2016-11-24 SURGICAL SUPPLY — 21 items

## 2016-11-24 NOTE — Anesthesia Preprocedure Evaluation (Signed)
Anesthesia Evaluation  Patient identified by MRN, date of birth, ID band Patient awake    Reviewed: Allergy & Precautions, NPO status , Patient's Chart, lab work & pertinent test results  Airway Mallampati: II  TM Distance: >3 FB Neck ROM: Full    Dental no notable dental hx.    Pulmonary neg pulmonary ROS,    Pulmonary exam normal breath sounds clear to auscultation       Cardiovascular negative cardio ROS Normal cardiovascular exam Rhythm:Regular Rate:Normal     Neuro/Psych negative neurological ROS  negative psych ROS   GI/Hepatic Neg liver ROS, GERD  ,  Endo/Other  Hypothyroidism   Renal/GU negative Renal ROS  negative genitourinary   Musculoskeletal negative musculoskeletal ROS (+)   Abdominal   Peds negative pediatric ROS (+)  Hematology negative hematology ROS (+)   Anesthesia Other Findings   Reproductive/Obstetrics negative OB ROS                             Anesthesia Physical Anesthesia Plan  ASA: II  Anesthesia Plan: MAC   Post-op Pain Management:    Induction: Intravenous  Airway Management Planned: Nasal Cannula  Additional Equipment:   Intra-op Plan:   Post-operative Plan:   Informed Consent: I have reviewed the patients History and Physical, chart, labs and discussed the procedure including the risks, benefits and alternatives for the proposed anesthesia with the patient or authorized representative who has indicated his/her understanding and acceptance.   Dental advisory given  Plan Discussed with: CRNA and Surgeon  Anesthesia Plan Comments:         Anesthesia Quick Evaluation

## 2016-11-24 NOTE — Anesthesia Postprocedure Evaluation (Signed)
Anesthesia Post Note  Patient: Brittany Moss  Procedure(s) Performed: Procedure(s) (LRB): COLONOSCOPY WITH PROPOFOL (N/A)  Patient location during evaluation: PACU Anesthesia Type: MAC Level of consciousness: awake and alert Pain management: pain level controlled Vital Signs Assessment: post-procedure vital signs reviewed and stable Respiratory status: spontaneous breathing, nonlabored ventilation, respiratory function stable and patient connected to nasal cannula oxygen Cardiovascular status: stable and blood pressure returned to baseline Anesthetic complications: no       Last Vitals:  Vitals:   11/24/16 1100 11/24/16 1105  BP: 112/61   Pulse: (!) 59 (!) 58  Resp: 12 14  Temp:      Last Pain:  Vitals:   11/24/16 1045  TempSrc: Oral                 Ginevra Tacker S

## 2016-11-24 NOTE — H&P (Signed)
Procedure: Baseline screening colonoscopy  History: The patient is a 51 year old female born Feb 11, 1966. She is scheduled to undergo her first screening colonoscopy with polypectomy to prevent colon cancer.  Past medical history: Hypothyroidism. Migraine headache syndrome. Irritable bowel syndrome. Exercise-induced asthma. Excedrin-induced peptic ulcer disease.  Exam: The patient is alert and lying comfortably on the endoscopy stretcher. Abdomen is soft and nontender to palpation. Lungs are clear to auscultation. Cardiac exam reveals a regular rhythm.  Plan: Proceed with screening colonoscopy

## 2016-11-24 NOTE — Op Note (Signed)
[facility] Patient Name: Brittany Moss Procedure Date: 11/24/2016 MRN: [id number] Attending MD: Garlan Fair , MD Date of Birth: [birth year] CSN: [id number] Age: 51 Admit Type: Outpatient Procedure:                Colonoscopy Indications:              Screening for colorectal malignant neoplasm Providers:                Garlan Fair, MD, Cleda Daub, RN, Tinnie Gens, Technician, Cletis Athens, Technician Referring MD:              Medicines:                Propofol per Anesthesia Complications:            No immediate complications. Estimated Blood Loss:     Estimated blood loss was minimal. Procedure:                Pre-Anesthesia Assessment:                           - Prior to the procedure, a History and Physical                            was performed, and patient medications and                            allergies were reviewed. The patient's tolerance of                            previous anesthesia was also reviewed. The risks                            and benefits of the procedure and the sedation                            options and risks were discussed with the patient.                            All questions were answered, and informed consent                            was obtained. Prior Anticoagulants: The patient has                            taken no previous anticoagulant or antiplatelet                            agents. ASA Grade Assessment: II - A patient with                            mild systemic disease. After reviewing the risks  and benefits, the patient was deemed in                            satisfactory condition to undergo the procedure.                           After obtaining informed consent, the colonoscope                            was passed under direct vision. Throughout the                            procedure, the patient's blood pressure, pulse, and                          oxygen saturations were monitored continuously. The                            EC-[id number] ([id number]) scope was introduced through                            the anus and advanced to the the cecum, identified                            by appendiceal orifice and ileocecal valve. The                            colonoscopy was performed without difficulty. The                            patient tolerated the procedure well. The quality                            of the bowel preparation was good. The terminal                            ileum, the ileocecal valve, the appendiceal orifice                            and the rectum were photographed. Scope In: 10:11:17 AM Scope Out: 10:39:56 AM Total Procedure Duration: 0 hours 28 minutes 39 seconds  Findings:      The perianal and digital rectal examinations were normal.      A 6 mm polyp was found in the cecum. The polyp was sessile. The polyp       was removed with a cold snare. Resection and retrieval were complete.      The exam was otherwise without abnormality. Impression:               - One 6 mm polyp in the cecum, removed with a cold                            snare. Resected and retrieved.                           -  The examination was otherwise normal. Moderate Sedation:      N/A- Per Anesthesia Care Recommendation:           - Patient has a contact number available for                            emergencies. The signs and symptoms of potential                            delayed complications were discussed with the                            patient. Return to normal activities tomorrow.                            Written discharge instructions were provided to the                            patient.                           - Repeat colonoscopy date to be determined after                            pending pathology results are reviewed for                            surveillance.                            - Resume previous diet.                           - Continue present medications. Procedure Code(s):        --- Professional ---                           [phone], Colonoscopy, flexible; with removal of                            tumor(s), polyp(s), or other lesion(s) by snare                            technique Diagnosis Code(s):        --- Professional ---                           Z12.11, Encounter for screening for malignant                            neoplasm of colon                           D12.0, Benign neoplasm of cecum CPT copyright 2016 [employer]. All rights reserved. The codes documented in this report are preliminary and upon coder review may  be revised to meet current compliance requirements. Earle Gell, MD Garlan Fair,  MD 11/24/2016 10:45:48 AM This report has been signed electronically. Number of Addenda: 0

## 2016-11-24 NOTE — Discharge Instructions (Signed)

## 2016-11-24 NOTE — Transfer of Care (Signed)
Immediate Anesthesia Transfer of Care Note  Patient: Brittany Moss  Procedure(s) Performed: Procedure(s): COLONOSCOPY WITH PROPOFOL (N/A)  Patient Location: PACU  Anesthesia Type:MAC  Level of Consciousness:  sedated, patient cooperative and responds to stimulation  Airway & Oxygen Therapy:Patient Spontanous Breathing and Patient connected to face mask oxgen  Post-op Assessment:  Report given to PACU RN and Post -op Vital signs reviewed and stable  Post vital signs:  Reviewed and stable  Last Vitals:  Vitals:   11/24/16 0941  BP: 138/63  Pulse: 67  Resp: 16  Temp: 65.4 C    Complications: No apparent anesthesia complications

## 2016-11-25 ENCOUNTER — Encounter (HOSPITAL_COMMUNITY): Payer: Self-pay | Admitting: Gastroenterology

## 2017-01-05 NOTE — Anesthesia Postprocedure Evaluation (Signed)
Anesthesia Post Note  Patient: Brittany Moss  Procedure(s) Performed: Procedure(s) (LRB): COLONOSCOPY WITH PROPOFOL (N/A)     Anesthesia Post Evaluation  Last Vitals:  Vitals:   11/24/16 1100 11/24/16 1105  BP: 112/61   Pulse: (!) 59 (!) 58  Resp: 12 14  Temp:      Last Pain:  Vitals:   11/24/16 1045  TempSrc: Oral                 Dayjah Selman S

## 2017-01-05 NOTE — Addendum Note (Signed)
Addendum  created 01/05/17 1339 by Myrtie Soman, MD   Sign clinical note

## 2017-07-23 ENCOUNTER — Ambulatory Visit (INDEPENDENT_AMBULATORY_CARE_PROVIDER_SITE_OTHER): Payer: 59 | Admitting: Gynecology

## 2017-07-23 ENCOUNTER — Encounter: Payer: Self-pay | Admitting: Gynecology

## 2017-07-23 VITALS — BP 118/74 | Ht 64.0 in | Wt 142.0 lb

## 2017-07-23 DIAGNOSIS — N952 Postmenopausal atrophic vaginitis: Secondary | ICD-10-CM | POA: Diagnosis not present

## 2017-07-23 DIAGNOSIS — Z01411 Encounter for gynecological examination (general) (routine) with abnormal findings: Secondary | ICD-10-CM

## 2017-07-23 NOTE — Progress Notes (Signed)
    Brittany Moss March 23, 1966 [id number]        51 y.o.  G3P0030 for annual gynecologic exam.  Patient complaining of vaginal dryness with painful intercourse.  Last menstrual period reported April 2017.  No bleeding since.  Had hot flushes up until about 6 months ago and no longer is having any significant hot flushes or night sweats.  Past medical history,surgical history, problem list, medications, allergies, family history and social history were all reviewed and documented as reviewed in the EPIC chart.  ROS:  Performed with pertinent positives and negatives included in the history, assessment and plan.   Additional significant findings : None   Exam: [name] assistant Vitals:   07/23/17 1605  BP: 118/74  Weight: 142 lb (64.4 kg)  Height: 5\' 4"  (1.626 m)   Body mass index is 24.37 kg/m.  General appearance:  Normal affect, orientation and appearance. Skin: Grossly normal HEENT: Without gross lesions.  No cervical or supraclavicular adenopathy. Thyroid normal.  Lungs:  Clear without wheezing, rales or rhonchi Cardiac: RR, without RMG Abdominal:  Soft, nontender, without masses, guarding, rebound, organomegaly or hernia Breasts:  Examined lying and sitting without masses, retractions, discharge or axillary adenopathy. Pelvic:  Ext, BUS, Vagina: With mild atrophic changes  Cervix: Normal   Uterus: Anteverted, normal size, shape and contour, midline and mobile nontender   Adnexa: Without masses or tenderness    Anus and perineum: Normal   Rectovaginal: Normal sphincter tone without palpated masses or tenderness.    Assessment/Plan:  51 y.o. G53P0030 female for annual gynecologic exam.   1. Postmenopausal/mild atrophic changes/dyspareunia.  I reviewed the menopause with the patient and the issues of symptoms to include dyspareunia.  Has tried OTC lubricants.  Not having significant global symptoms such as hot flushes or sweats.  Options for management were reviewed to  include trying a different oil based lubricant, vaginal estrogen to include cream, tablets, rings and Osphena, global HRT.  Risks versus benefits of each approach were reviewed to include the risks of thrombosis such as stroke heart attack DVT and breast cancer.  Possible absorption transvaginally with thrombosis, breast and endometrial stimulation discussed.  After lengthy discussion the patient prefers to monitor and try oil-based lubricants.  We will follow-up if continues to be an issue. 2. Pap smear/HPV 2014.  Regular Pap smear 2017.  No Pap smear done today.  History of LEEP 1992 with normal Pap smears afterwards. 3. Mammography 10/2016.  Continue with annual mammography when due.  Breast exam normal today. 4. Colonoscopy 2018.  Repeat at their recommended interval. 5. Health maintenance.  No routine lab work done as patient does this elsewhere.  Follow-up 1 year, sooner as needed.   [name] MD, 4:28 PM 07/23/2017

## 2017-07-23 NOTE — Patient Instructions (Signed)
Follow-up in 1 year, sooner as needed. 

## 2017-07-31 ENCOUNTER — Telehealth: Payer: Self-pay | Admitting: *Deleted

## 2017-07-31 NOTE — Telephone Encounter (Signed)
I am not sure that we knew she had a history of herpes.  From a genital herpes outbreak standpoint her 2 options would be Valtrex 500 mg tablets 1 twice daily for the first several days of an outbreak to help suppress the length of the outbreak.  From a suppressive standpoint she could take 1 tablet daily to help suppress outbreaks.  Treatment for herpes cold sores are 4 tablets at the earliest sign of outbreak and then 4 more tablets 12 hours later and that is it.  I cannot supply her husband as that is fraudulent under her health plan provide him medication under her insurance.  Let me know which approach as far as treatment she is interested in and we can prescribe her the medication.

## 2017-07-31 NOTE — Telephone Encounter (Signed)
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 Patient said she has had fever blisters and HSV 2 outbreak this month. Please advise

## 2017-08-03 MED ORDER — VALACYCLOVIR HCL 1 G PO TABS: ORAL_TABLET | ORAL | 0 refills | Status: DC

## 2017-08-03 NOTE — Telephone Encounter (Signed)
Treatment dose for fever blisters is 2 g dose at earliest onset of symptoms and the repeat once in 12 hours.  So recommend Valtrex 1 g tablet to take 2 at earliest onset of symptoms and repeat 2 tablets 12 hours later.  Dispense #[id number]

## 2017-08-03 NOTE — Telephone Encounter (Signed)
Rx sent 

## 2017-08-03 NOTE — Telephone Encounter (Signed)
Patient said she was taking as needed for oral outbreak and doesn't have a Rx with directions/dose in front of her. She did mention dose of 500mg  or 1,000mg  and cutting one pill in half,I told her that is a valacyclovir dose she states maybe that is correct Rx. Patient seems very confused about the name of Rx, I do see in epic under medications historical provider lists Valtrex 500 mg "take 2-3 a month to prevent fever blister". She did say she doesn't take daily for suppressive, only as needed on outbreak. Please advise

## 2017-08-03 NOTE — Telephone Encounter (Signed)
I do not see where acyclovir was prescribed in her past records.  Does not answer my questions I posed with my last note as far as taking from a suppressive standpoint or as an outbreak standpoint.  Does not answer whether she is using this for oral herpes or genital herpes and lastly if she was using acyclovir how was she taking it in the past

## 2017-08-03 NOTE — Telephone Encounter (Signed)
Left message for pt to call.

## 2017-08-03 NOTE — Telephone Encounter (Signed)
Patient informed, would like Rx for acyclovir tablets states this is Rx she has had in past. Please advise

## 2017-08-10 ENCOUNTER — Ambulatory Visit (INDEPENDENT_AMBULATORY_CARE_PROVIDER_SITE_OTHER): Payer: 59 | Admitting: Neurology

## 2017-08-10 ENCOUNTER — Encounter: Payer: Self-pay | Admitting: Neurology

## 2017-08-10 VITALS — BP 114/67 | HR 69 | Ht 64.0 in | Wt 146.0 lb

## 2017-08-10 DIAGNOSIS — Z818 Family history of other mental and behavioral disorders: Secondary | ICD-10-CM | POA: Diagnosis not present

## 2017-08-10 DIAGNOSIS — E538 Deficiency of other specified B group vitamins: Secondary | ICD-10-CM

## 2017-08-10 DIAGNOSIS — E559 Vitamin D deficiency, unspecified: Secondary | ICD-10-CM | POA: Diagnosis not present

## 2017-08-10 NOTE — Patient Instructions (Addendum)
[employer] [facility]   Dementia Dementia is the loss of two or more brain functions, such as:  Memory.  Decision making.  Behavior.  Speaking.  Thinking.  Problem solving.  There are many types of dementia. The most common type is called progressive dementia. Progressive dementia gets worse with time and it is irreversible. An example of this type of dementia is Alzheimer disease. What are the causes? This condition may be caused by:  Nerve cell damage in the brain.  Genetic mutations.  Certain medicines.  Multiple small strokes.  An infection, such as chronic meningitis.  A metabolic problem, such as vitamin B12 deficiency or thyroid disease.  Pressure on the brain, such as from a tumor or blood clot.  What are the signs or symptoms? Symptoms of this condition include:  Sudden changes in mood.  Depression.  Problems with balance.  Changes in personality.  Poor short-term memory.  Agitation.  Delusions.  Hallucinations.  Having a hard time: ? Speaking thoughts. ? Finding words. ? Solving problems. ? Doing familiar tasks. ? Understanding familiar ideas.  How is this diagnosed? This condition is diagnosed with an assessment by your health care provider. During this assessment, your health care provider will talk with you and your family, friends, or caregivers about your symptoms. A thorough medical history will be taken, and you will have a physical exam and tests. Tests may include:  Lab tests, such as blood or urine tests.  Imaging tests, such as a CT scan, PET scan, or MRI.  A lumbar puncture. This test involves removing and testing a small amount of the fluid that surrounds the brain and spinal cord.  An electroencephalogram (EEG). In this test, small metal discs are used to measure electrical activity in the brain.  Memory tests, cognitive tests, and neuropsychological tests. These tests evaluate brain function.  How is this  treated? Treatment depends on the cause of the dementia. It may involve taking medicines that may help:  To control the dementia.  To slow down the disease.  To manage symptoms.  In some cases, treating the cause of the dementia can improve symptoms, reverse symptoms, or slow down how quickly the dementia gets worse. Your health care provider can help direct you to support groups, organizations, and other health care providers who can help with decisions about your care. Follow these instructions at home: Medicine  Take over-the-counter and prescription medicines only as told by your health care provider.  Avoid taking medicines that can affect thinking, such as pain or sleeping medicines. Lifestyle   Make healthy lifestyle choices: ? Be physically active as told by your health care provider. ? Do not use any tobacco products, such as cigarettes, chewing tobacco, and e-cigarettes. If you need help quitting, ask your health care provider. ? Eat a healthy diet. ? Practice stress-management techniques when you get stressed. ? Stay social.  Drink enough fluid to keep your urine clear or pale yellow.  Make sure to get quality sleep. These tips can help you to get a good night's rest: ? Avoid napping during the day. ? Keep your sleeping area dark and cool. ? Avoid exercising during the few hours before you go to bed. ? Avoid caffeine products in the evening. General instructions  Work with your health care provider to determine what you need help with and what your safety needs are.  If you were given a bracelet that tracks your location, make sure to wear it.  Keep all follow-up  visits as told by your health care provider. This is important. Contact a health care provider if:  You have any new symptoms.  You have problems with choking or swallowing.  You have any symptoms of a different illness. Get help right away if:  You develop a fever.  You have new or worsening  confusion.  You have new or worsening sleepiness.  You have a hard time staying awake.  You or your family members become concerned for your safety. This information is not intended to replace advice given to you by your health care provider. Make sure you discuss any questions you have with your health care provider. Document Released: 01/14/2001 Document Revised: 11/29/2015 Document Reviewed: 04/18/2015 Elsevier Interactive Patient Education  [employer].

## 2017-08-10 NOTE — Progress Notes (Signed)
[facility]    Provider:  Dr Jaynee Eagles Referring Provider: Josetta Huddle, MD Primary Care Physician:  Josetta Huddle, MD  CC:  Family history of dementia  HPI:  Brittany Moss is a 52 y.o. female here as a referral from Dr. Inda Merlin for family history of dementia. She feels less sharp, she is worried due to extensive history of dementia in parents. Her mother is a homebody and so is she. She becomes overwhelmed at work. Discussed dementia at length. She sees her parents and is worried. She doesn;t get lost. Performs all her own ADLs and IADLs. She is successful at work. She has noticed this over the years but not progressive. No inciting events, no head trauma, no triggers, stress may make it worse, nothing makes her symptoms better. She has been in an exceptionally stressful position. She is in a high level job. No other focal neurologic deficits, associated symptoms, inciting events or modifiable factors. Father was symptomatic in his 67s but also had aNPH and a shunt as well as vascular dementia. Mother developed in 80s memory problems, unclear what type of dementia. No symptoms of sleep apnea.    Review of Systems: Patient complains of symptoms per HPI as well as the following symptoms: palpitations. Pertinent negatives and positives per HPI. All others negative.   Social History   Socioeconomic History  . Marital status: Married    Spouse name: Not on file  . Number of children: Not on file  . Years of education: Not on file  . Highest education level: Not on file  Social Needs  . Financial resource strain: Not on file  . Food insecurity - worry: Not on file  . Food insecurity - inability: Not on file  . Transportation needs - medical: Not on file  . Transportation needs - non-medical: Not on file  Occupational History  . Not on file  Tobacco Use  . Smoking status: Never Smoker  . Smokeless tobacco: Never Used  Substance and Sexual Activity  . Alcohol use: Yes    Alcohol/week: 0.6 oz    Types: 1 Glasses of wine per week    Comment: Social  . Drug use: No  . Sexual activity: Yes    Birth control/protection: None    Comment: 1st intercourse 52 yo-More than 5 partners  Other Topics Concern  . Not on file  Social History Narrative  . Not on file    Family History  Problem Relation Age of Onset  . Hypertension Mother   . Thyroid disease Mother   . Dementia Mother   . Dementia Father   . Hypertension Father   . Thyroid disease Father     Past Medical History:  Diagnosis Date  . Anemia 06/2016  . Cervical dysplasia   . Dysrhythmia    ovv pvr or flutter no meds for  . GERD (gastroesophageal reflux disease)   . GI bleed 06/2016  . Headache   . Hypothyroidism     Past Surgical History:  Procedure Laterality Date  . CERVICAL BIOPSY  W/ LOOP ELECTRODE EXCISION  1992   CIN I margins free  . COLONOSCOPY WITH PROPOFOL N/A 11/24/2016   Procedure: COLONOSCOPY WITH PROPOFOL;  Surgeon: Garlan Fair, MD;  Location: [facility];  Service: Endoscopy;  Laterality: N/A;  . COSMETIC SURGERY    . LIPOSUCTION     abdominal area and thighs  . REFRACTIVE SURGERY Bilateral   . UPPER GI ENDOSCOPY     x2  Current Outpatient Medications  Medication Sig Dispense Refill  . aspirin-acetaminophen-caffeine (EXCEDRIN MIGRAINE) 250-250-65 MG tablet Take 1 tablet by mouth daily as needed for headache.    . Biotin 10000 MCG TABS Take 10,000 mcg by mouth daily.    . carisoprodol (SOMA) 350 MG tablet Take 350 mg by mouth 2 (two) times daily as needed for pain.  3  . [employer] VITAMIN D 2000 units CAPS Take 2,000 Units by mouth daily.  3  . levothyroxine (SYNTHROID, LEVOTHROID) 175 MCG tablet Take 175 mcg by mouth daily.  2  . Omega-3 Fatty Acids (OMEGA 3 PO) Take 2 capsules by mouth daily.    . pantoprazole (PROTONIX) 40 MG tablet Take 40 mg by mouth See admin instructions. Takes 2-3 times a week at bedtime    . Probiotic Product (PROBIOTIC PO) Take 2 capsules  by mouth at bedtime.    . sertraline (ZOLOFT) 100 MG tablet Take 50-100 mg by mouth at bedtime. Takes 1/2 tablet a couple times a week and all other days takes 1 tablet    . thiamine (VITAMIN B-1) 100 MG tablet Take 100 mg by mouth daily.    . valACYclovir (VALTREX) 1000 MG tablet Take 2 tablets at earliest onset of symptoms and repeat 2 tablets 12 hours later 20 tablet 0   No current facility-administered medications for this visit.     Allergies as of 08/10/2017  . (No Known Allergies)    Vitals: BP 114/67 (BP Location: Right Arm, Patient Position: Sitting, Cuff Size: Small)   Pulse 69   Ht 5\' 4"  (1.626 m)   Wt 146 lb (66.2 kg)   LMP 11/27/2015 (Approximate)   BMI 25.06 kg/m  Last Weight:  Wt Readings from Last 1 Encounters:  08/10/17 146 lb (66.2 kg)   Last Height:   Ht Readings from Last 1 Encounters:  08/10/17 5\' 4"  (1.626 m)    Physical exam: Exam: Gen: NAD, conversant, well nourised,  well groomed                     CV: RRR, no MRG. No Carotid Bruits. No peripheral edema, warm, nontender Eyes: Conjunctivae clear without exudates or hemorrhage  Neuro: Detailed Neurologic Exam  Speech:    Speech is normal; fluent and spontaneous with normal comprehension.  Cognition:    The patient is oriented to person, place, and time;     recent and remote memory intact;     language fluent;     normal attention, concentration,     fund of knowledge Cranial Nerves:    The pupils are equal, round, and reactive to light. The fundi are normal and spontaneous venous pulsations are present. Visual fields are full to finger confrontation. Extraocular movements are intact. Trigeminal sensation is intact and the muscles of mastication are normal. The face is symmetric. The palate elevates in the midline. Hearing intact. Voice is normal. Shoulder shrug is normal. The tongue has normal motion without fasciculations.   Coordination:    Normal finger to nose and heel to shin. Normal  rapid alternating movements.   Gait:    Heel-toe and tandem gait are normal.   Motor Observation:    No asymmetry, no atrophy, and no involuntary movements noted. Tone:    Normal muscle tone.    Posture:    Posture is normal. normal erect    Strength:    Strength is V/V in the upper and lower limbs.      Sensation: intact  to LT     Reflex Exam:  DTR's:    Deep tendon reflexes in the upper and lower extremities are normal bilaterally.   Toes:    The toes are downgoing bilaterally.   Clonus:    Clonus is absent.      Assessment/Plan:  52 year old female with a FHx of dementia in both parents. Had a long discussion about dementia, risk factors, preventative strategies.    Patient requested referral to [facility], will refer  She is interested in clinical trials, we don;t have any for people with a family history of dementia who are not symptomatic.  Orders Placed This Encounter  Procedures  . B12 and Folate Panel  . Hemoglobin A1c  . Vitamin D pnl(25-hydrxy+1,25-dihy)-bld  . Ambulatory referral to Neurology      Sarina Ill, MD  [facility] [address] [place], [place] [address]  Phone [phone] Fax [phone]

## 2017-08-11 ENCOUNTER — Encounter: Payer: Self-pay | Admitting: Neurology

## 2017-08-11 DIAGNOSIS — Z818 Family history of other mental and behavioral disorders: Secondary | ICD-10-CM | POA: Insufficient documentation

## 2017-08-11 LAB — HEMOGLOBIN A1C
ESTIMATED AVERAGE GLUCOSE: 111 mg/dL
Hgb A1c MFr Bld: 5.5 % (ref 4.8–5.6)

## 2017-08-11 LAB — VITAMIN D PNL(25-HYDRXY+1,25-DIHY)-BLD
VIT D 1 25 DIHYDROXY: 42.9 pg/mL (ref 19.9–79.3)
Vit D, 25-Hydroxy: 41.5 ng/mL (ref 30.0–100.0)

## 2017-08-11 LAB — B12 AND FOLATE PANEL: VITAMIN B 12: 438 pg/mL (ref 232–1245)

## 2017-08-12 ENCOUNTER — Telehealth: Payer: Self-pay | Admitting: *Deleted

## 2017-08-12 NOTE — Telephone Encounter (Addendum)
Called and spoke with patient. She verbalized understanding that her labs are normal and she had no questions.   ----- Message from Melvenia Beam, MD sent at 08/11/2017  7:50 PM EST ----- Labs normal thanls

## 2017-11-06 ENCOUNTER — Other Ambulatory Visit: Payer: Self-pay | Admitting: Gynecology

## 2017-11-06 NOTE — Telephone Encounter (Signed)
07/31/17 Note in chart indicates she uses this for fever blisters.

## 2019-04-27 ENCOUNTER — Encounter: Payer: Self-pay | Admitting: Gynecology

## 2019-07-18 ENCOUNTER — Encounter: Payer: Self-pay | Admitting: Gynecology

## 2019-07-22 ENCOUNTER — Other Ambulatory Visit: Payer: Self-pay

## 2019-07-25 ENCOUNTER — Ambulatory Visit (INDEPENDENT_AMBULATORY_CARE_PROVIDER_SITE_OTHER): Payer: 59 | Admitting: Gynecology

## 2019-07-25 ENCOUNTER — Encounter: Payer: Self-pay | Admitting: Gynecology

## 2019-07-25 ENCOUNTER — Other Ambulatory Visit: Payer: Self-pay

## 2019-07-25 VITALS — BP 122/76 | Ht 63.5 in | Wt 152.0 lb

## 2019-07-25 DIAGNOSIS — Z79890 Hormone replacement therapy (postmenopausal): Secondary | ICD-10-CM | POA: Diagnosis not present

## 2019-07-25 DIAGNOSIS — Z01419 Encounter for gynecological examination (general) (routine) without abnormal findings: Secondary | ICD-10-CM | POA: Diagnosis not present

## 2019-07-25 MED ORDER — ESTRADIOL 0.5 MG PO TABS: 0.5000 mg | ORAL_TABLET | Freq: Every day | ORAL | 4 refills | Status: DC

## 2019-07-25 MED ORDER — PROGESTERONE MICRONIZED 100 MG PO CAPS: 100.0000 mg | ORAL_CAPSULE | Freq: Every day | ORAL | 4 refills | Status: DC

## 2019-07-25 NOTE — Patient Instructions (Signed)
Start on the new hormone replacement regiment.  Call if any issues with this or persistent bleeding.  Follow-up in 1 year for annual exam

## 2019-07-25 NOTE — Addendum Note (Signed)
Addended by: Nelva Nay on: 07/25/2019 11:47 AM   Modules accepted: Orders

## 2019-07-25 NOTE — Progress Notes (Signed)
    Brittany Moss 02-Feb-1966 [id number]        53 y.o.  G3P0030 for annual gynecologic exam.  Started on HRT this past year through office in [place].  Is on biest cream and Prometrium 200 mg nightly.  Was having night sweats and vaginal dryness which have resolved.  Past medical history,surgical history, problem list, medications, allergies, family history and social history were all reviewed and documented as reviewed in the EPIC chart.  ROS:  Performed with pertinent positives and negatives included in the history, assessment and plan.   Additional significant findings : None   Exam: [name] assistant Vitals:   07/25/19 1100  BP: 122/76  Weight: 152 lb (68.9 kg)  Height: 5' 3.5" (1.613 m)   Body mass index is 26.5 kg/m.  General appearance:  Normal affect, orientation and appearance. Skin: Grossly normal HEENT: Without gross lesions.  No cervical or supraclavicular adenopathy. Thyroid normal.  Lungs:  Clear without wheezing, rales or rhonchi Cardiac: RR, without RMG Abdominal:  Soft, nontender, without masses, guarding, rebound, organomegaly or hernia Breasts:  Examined lying and sitting without masses, retractions, discharge or axillary adenopathy. Pelvic:  Ext, BUS, Vagina: Normal  Cervix: Normal.  Pap smear done  Uterus: Anteverted, normal size, shape and contour, midline and mobile nontender   Adnexa: Without masses or tenderness    Anus and perineum: Normal   Rectovaginal: Normal sphincter tone without palpated masses or tenderness.    Assessment/Plan:  53 y.o. G16P0030 female for annual gynecologic exam.   1. Postmenopausal/HRT.  Currently on estrogen cream and Prometrium at bedtime.  Asked if I could prescribe HRT for her.  We discussed various regimens.  We discussed the risks versus benefits to include increased risk of thrombosis such as stroke heart attack DVT and breast cancer.  Benefits as far as symptom relief possible cardiovascular and bone health  when started early also discussed.  She would like to continue for now.  Estradiol 0.5 mg tablets and Prometrium 100 mg nightly prescribed.  She will stop the other regiment.  Will call if any bleeding. 2. Mammography 07/2019.  Continue with annual mammography next year.  Breast exam normal today. 3. Pap smear 2017.  Pap smear done today.  History of LEEP 1992 with normal Pap smears since. 4. Colonoscopy 2018.  Repeat at their recommended interval. 5. DEXA never.  Will plan further into the menopause. 6. Health maintenance.  No routine lab work done as patient does this elsewhere.  Follow-up 1 year, sooner as needed.   [name] MD, 11:39 AM 07/25/2019

## 2019-07-26 LAB — PAP IG W/ RFLX HPV ASCU

## 2020-08-22 ENCOUNTER — Other Ambulatory Visit: Payer: Self-pay

## 2020-08-22 ENCOUNTER — Encounter: Payer: Self-pay | Admitting: Obstetrics and Gynecology

## 2020-08-22 ENCOUNTER — Ambulatory Visit (INDEPENDENT_AMBULATORY_CARE_PROVIDER_SITE_OTHER): Payer: 59 | Admitting: Obstetrics and Gynecology

## 2020-08-22 VITALS — BP 122/80 | Ht 63.0 in | Wt 156.0 lb

## 2020-08-22 DIAGNOSIS — Z79890 Hormone replacement therapy (postmenopausal): Secondary | ICD-10-CM | POA: Diagnosis not present

## 2020-08-22 DIAGNOSIS — Z01419 Encounter for gynecological examination (general) (routine) without abnormal findings: Secondary | ICD-10-CM

## 2020-08-22 MED ORDER — PROGESTERONE MICRONIZED 100 MG PO CAPS: 100.0000 mg | ORAL_CAPSULE | Freq: Every day | ORAL | 4 refills | Status: DC

## 2020-08-22 MED ORDER — ESTRADIOL 0.5 MG PO TABS: 0.5000 mg | ORAL_TABLET | Freq: Every day | ORAL | 4 refills | Status: DC

## 2020-08-22 NOTE — Progress Notes (Signed)
Brittany Moss 16-Nov-1965 [id number]  SUBJECTIVE:  55 y.o. G88P0030 female for annual routine gynecologic exam. She has no gynecologic concerns.  Current Outpatient Medications  Medication Sig Dispense Refill  . aspirin-acetaminophen-caffeine (EXCEDRIN MIGRAINE) 250-250-65 MG tablet Take 1 tablet by mouth daily as needed for headache.    . carisoprodol (SOMA) 350 MG tablet Take 350 mg by mouth 2 (two) times daily as needed for pain.  3  . CVS VITAMIN D 2000 units CAPS Take 2,000 Units by mouth daily.  3  . levothyroxine (SYNTHROID, LEVOTHROID) 175 MCG tablet Take 175 mcg by mouth daily.  2  . pantoprazole (PROTONIX) 40 MG tablet Take 40 mg by mouth See admin instructions. Takes 2-3 times a week at bedtime    . progesterone (PROMETRIUM) 100 MG capsule Take 1 capsule (100 mg total) by mouth at bedtime. 90 capsule 4  . progesterone (PROMETRIUM) 100 MG capsule Take 1 capsule (100 mg total) by mouth daily. 90 capsule 4  . sertraline (ZOLOFT) 100 MG tablet Take 50-100 mg by mouth at bedtime. Takes 1/2 tablet a couple times a week and all other days takes 1 tablet    . valACYclovir (VALTREX) 1000 MG tablet TAKE 2 TABLETS AT EARLIEST ONSET OF SYMPTOMS AND REPEAT 2 TABLETS 12 HOURS LATER 20 tablet 0  . estradiol (ESTRACE) 0.5 MG tablet Take 1 tablet (0.5 mg total) by mouth daily. 90 tablet 4   No current facility-administered medications for this visit.   Allergies: Patient has no known allergies.  Patient's last menstrual period was 11/27/2015 (approximate).  Past medical history,surgical history, problem list, medications, allergies, family history and social history were all reviewed and documented as reviewed in the EPIC chart.  ROS: Pertinent positives and negatives as reviewed in HPI    OBJECTIVE:  BP 122/80 (BP Location: Right Arm, Patient Position: Sitting, Cuff Size: Normal)   Ht 5\' 3"  (1.6 m)   Wt 156 lb (70.8 kg)   LMP 11/27/2015 (Approximate)   BMI 27.63 kg/m  The patient  appears well, alert, oriented, in no distress. ENT normal.  Neck supple. No cervical or supraclavicular adenopathy or thyromegaly.  Lungs are clear, good air entry, no wheezes, rhonchi or rales. S1 and S2 normal, no murmurs, regular rate and rhythm.  Abdomen soft without tenderness, guarding, mass or organomegaly.  Neurological is normal, no focal findings.  BREAST EXAM: breasts appear normal, no suspicious masses, no skin or nipple changes or axillary nodes  PELVIC EXAM: VULVA: normal appearing vulva with atrophic change, no masses, tenderness or lesions, VAGINA: normal appearing vagina with trophic change, normal color and discharge, no lesions, CERVIX: normal appearing cervix without discharge or lesions, UTERUS: uterus is normal size, shape, consistency and nontender, ADNEXA: normal adnexa in size, nontender and no masses  Chaperone: Wandra Scot Bonham present during the examination  ASSESSMENT:  55 y.o. G3P0030 here for annual gynecologic exam  PLAN:   1. Postmenopausal/HRT. Has been using estradiol 0.5 mg daily and Prometrium 100 mg nightly at bedtime. Doing well with this regimen. Good control of menopausal symptoms. No vaginal bleeding. Would like to continue. Risks of thrombotic diseases such as heart attack, stroke, DVT, PE and the breast cancer issue reviewed. She feels benefits outweigh risk at this point so I did give her a refill for 1 year supply. 2. Pap smear 07/2019. History of LEEP 1992 for dysplasia, normal Pap smears since then. History of abnormal Pap smears.  Next Pap smear due 2023 following the current guidelines recommending  the 3 year interval. 3. Mammogram ported 07/2020.  Normal breast exam today. Continue with annual mammograms. 4. Colonoscopy 2018.  She will follow up at the interval recommended by her GI specialist.   5. DEXA never. Plan when further into menopause. 6. Health maintenance.  No labs today as she normally has these completed with her primary care  provider.  The patient is aware that I will only be at this practice until early March 2022 so she knows to make sure she requests any needed follow-up beyond when I am no longer at the practice.  Return annually or sooner, prn.  Joseph Pierini MD 08/22/20

## 2021-05-27 DIAGNOSIS — H25043 Posterior subcapsular polar age-related cataract, bilateral: Secondary | ICD-10-CM | POA: Diagnosis not present

## 2021-05-27 DIAGNOSIS — H524 Presbyopia: Secondary | ICD-10-CM | POA: Diagnosis not present

## 2021-05-27 DIAGNOSIS — H2513 Age-related nuclear cataract, bilateral: Secondary | ICD-10-CM | POA: Diagnosis not present

## 2021-06-25 DIAGNOSIS — H25812 Combined forms of age-related cataract, left eye: Secondary | ICD-10-CM | POA: Diagnosis not present

## 2021-06-25 DIAGNOSIS — H25042 Posterior subcapsular polar age-related cataract, left eye: Secondary | ICD-10-CM | POA: Diagnosis not present

## 2021-06-25 DIAGNOSIS — H2512 Age-related nuclear cataract, left eye: Secondary | ICD-10-CM | POA: Diagnosis not present

## 2021-08-27 DIAGNOSIS — H25811 Combined forms of age-related cataract, right eye: Secondary | ICD-10-CM | POA: Diagnosis not present

## 2021-08-27 DIAGNOSIS — H2511 Age-related nuclear cataract, right eye: Secondary | ICD-10-CM | POA: Diagnosis not present

## 2021-08-27 DIAGNOSIS — H25041 Posterior subcapsular polar age-related cataract, right eye: Secondary | ICD-10-CM | POA: Diagnosis not present

## 2021-08-29 DIAGNOSIS — Z23 Encounter for immunization: Secondary | ICD-10-CM | POA: Diagnosis not present

## 2021-08-29 DIAGNOSIS — L659 Nonscarring hair loss, unspecified: Secondary | ICD-10-CM | POA: Diagnosis not present

## 2021-08-29 DIAGNOSIS — E039 Hypothyroidism, unspecified: Secondary | ICD-10-CM | POA: Diagnosis not present

## 2021-08-29 DIAGNOSIS — Z0000 Encounter for general adult medical examination without abnormal findings: Secondary | ICD-10-CM | POA: Diagnosis not present

## 2021-08-29 DIAGNOSIS — Z79899 Other long term (current) drug therapy: Secondary | ICD-10-CM | POA: Diagnosis not present

## 2021-08-29 DIAGNOSIS — Z818 Family history of other mental and behavioral disorders: Secondary | ICD-10-CM | POA: Diagnosis not present

## 2021-08-29 DIAGNOSIS — E559 Vitamin D deficiency, unspecified: Secondary | ICD-10-CM | POA: Diagnosis not present

## 2021-09-03 ENCOUNTER — Other Ambulatory Visit: Payer: Self-pay | Admitting: *Deleted

## 2021-09-03 MED ORDER — ESTRADIOL 0.5 MG PO TABS: 0.5000 mg | ORAL_TABLET | Freq: Every day | ORAL | 0 refills | Status: DC

## 2021-09-03 NOTE — Telephone Encounter (Signed)
Patient annual exam scheduled with Brittany Moss on 09/18/21, requesting refill on estradiol 0.5  mg tablet. Last annual exam 08/22/20 with Dr.Kendall. last mammogram was last mammogram was 2021 as [facility].

## 2021-09-03 NOTE — Telephone Encounter (Signed)
Patient annual exam scheduled with Jami on 09/18/21, requesting refill on progesterone 100 mg capsule. Last annual exam 08/22/20 with Dr.Kendall. last mammogram was last mammogram was 2021 as [facility].

## 2021-09-04 MED ORDER — PROGESTERONE MICRONIZED 100 MG PO CAPS: 100.0000 mg | ORAL_CAPSULE | Freq: Every day | ORAL | 0 refills | Status: AC

## 2021-09-17 DIAGNOSIS — Z1231 Encounter for screening mammogram for malignant neoplasm of breast: Secondary | ICD-10-CM | POA: Diagnosis not present

## 2021-09-17 NOTE — Progress Notes (Signed)
° °  Brittany Moss [birth year] [id number]   History: Postmenopausal 56 y.o. presents for annual exam. Having some breakthrough hot flashes and night sweats on current HRT regimen.    Gynecologic History Patient's last menstrual period was 11/27/2015 (approximate). Contraception: post menopausal status Last Pap: 2020. Results were: normal Last mammogram: 07/17/22 Results were: pending  Obstetric History OB History  Gravida Para Term Preterm AB Living  3       3 0  SAB IAB Ectopic Multiple Live Births               # Outcome Date GA Lbr Len/2nd Weight Sex Delivery Anes PTL Lv  3 AB           2 AB           1 AB              The following portions of the patient's history were reviewed and updated as appropriate: allergies, current medications, past family history, past medical history, past social history, past surgical history, and problem list.  Review of Systems Pertinent items noted in HPI and remainder of comprehensive ROS otherwise negative.  Past medical history, past surgical history, family history and social history were all reviewed and documented in the EPIC chart.  ROS:  A ROS was performed and pertinent positives and negatives are included. Review of Systems - Negative except hot flashes History obtained from the patient  Exam:  Vitals:   09/18/21 0810  BP: 114/76  Weight: 143 lb (64.9 kg)  Height: 5' 3.5" (1.613 m)   Body mass index is 24.93 kg/m.  General appearance:  Normal Thyroid:  Symmetrical, normal in size, without palpable masses or nodularity. Respiratory  Auscultation:  Clear without wheezing or rhonchi Cardiovascular  Auscultation:  Regular rate, without rubs, murmurs or gallops  Edema/varicosities:  Not grossly evident Abdominal  Soft,nontender, without masses, guarding or rebound.  Liver/spleen:  No organomegaly noted  Hernia:  None appreciated  Skin  Inspection:  Grossly normal Breasts: Examined lying and sitting.   Right: Without  masses, retractions, nipple discharge or axillary adenopathy.   Left: Without masses, retractions, nipple discharge or axillary adenopathy. Genitourinary   Inguinal/mons:  Normal without inguinal adenopathy  External genitalia:  Normal appearing vulva with no masses, tenderness, or lesions  BUS/Urethra/Skene's glands:  Normal  Vagina:  Normal appearing with normal color and discharge, no lesions. Atrophy: mild Prolapse: none  Cervix:  Normal appearing without discharge or lesions  Uterus:  Normal in size, shape and contour.  Midline and mobile, nontender  Adnexa/parametria:     Rt: Normal in size, without masses or tenderness.   Lt: Normal in size, without masses or tenderness.  Anus and perineum: Normal  Digital rectal exam: Normal sphincter tone without palpated masses or tenderness  Patient informed chaperone available to be present for breast and pelvic exam. Patient has requested no chaperone to be present. Patient has been advised what will be completed during breast and pelvic exam.   Assessment/Plan:   -Well woman exam -Pap with cotesting -Schedule mammo -Dexa baseline @60   -HRT- continue  progesterone, change to transdermal estrogen patch, will increase dose rxs sent   Discussed SBE, colonoscopy and DEXA screening as directed. Vit D and magnesium nightly. Recommend 186mins of exercise weekly, including weight bearing exercise. Encouraged the use of seatbelts and sunscreen.  Return in 1 year for annual or sooner prn.  Rubbie Battiest B WHNP-BC, 8:19 AM 09/18/2021

## 2021-09-18 ENCOUNTER — Other Ambulatory Visit: Payer: Self-pay

## 2021-09-18 ENCOUNTER — Other Ambulatory Visit (HOSPITAL_COMMUNITY)
Admission: RE | Admit: 2021-09-18 | Discharge: 2021-09-18 | Disposition: A | Discharge status: Home / Self Care | Payer: BC Managed Care – PPO | Source: Ambulatory Visit | Attending: Radiology | Admitting: Radiology

## 2021-09-18 ENCOUNTER — Encounter: Payer: Self-pay | Admitting: Radiology

## 2021-09-18 ENCOUNTER — Ambulatory Visit (INDEPENDENT_AMBULATORY_CARE_PROVIDER_SITE_OTHER): Payer: BC Managed Care – PPO | Admitting: Radiology

## 2021-09-18 VITALS — BP 114/76 | Ht 63.5 in | Wt 143.0 lb

## 2021-09-18 DIAGNOSIS — Z01419 Encounter for gynecological examination (general) (routine) without abnormal findings: Secondary | ICD-10-CM | POA: Insufficient documentation

## 2021-09-18 DIAGNOSIS — Z79890 Hormone replacement therapy (postmenopausal): Secondary | ICD-10-CM | POA: Diagnosis not present

## 2021-09-18 MED ORDER — ESTRADIOL 0.1 MG/24HR TD PTTW: 1.0000 | MEDICATED_PATCH | TRANSDERMAL | 4 refills | Status: DC

## 2021-09-18 MED ORDER — PROGESTERONE MICRONIZED 100 MG PO CAPS: 100.0000 mg | ORAL_CAPSULE | Freq: Every day | ORAL | 4 refills | Status: AC

## 2021-09-20 LAB — CYTOLOGY - PAP
Comment: NEGATIVE
Diagnosis: NEGATIVE
High risk HPV: NEGATIVE

## 2021-09-26 ENCOUNTER — Other Ambulatory Visit: Payer: Self-pay | Admitting: Obstetrics and Gynecology

## 2021-09-30 ENCOUNTER — Other Ambulatory Visit: Payer: Self-pay | Admitting: Obstetrics and Gynecology

## 2021-10-03 DIAGNOSIS — R922 Inconclusive mammogram: Secondary | ICD-10-CM | POA: Diagnosis not present

## 2021-11-21 DIAGNOSIS — H26491 Other secondary cataract, right eye: Secondary | ICD-10-CM | POA: Diagnosis not present

## 2021-12-11 DIAGNOSIS — E039 Hypothyroidism, unspecified: Secondary | ICD-10-CM | POA: Diagnosis not present

## 2022-09-26 DIAGNOSIS — R635 Abnormal weight gain: Secondary | ICD-10-CM | POA: Diagnosis not present

## 2022-11-03 ENCOUNTER — Other Ambulatory Visit: Payer: Self-pay | Admitting: Radiology

## 2022-11-03 NOTE — Telephone Encounter (Signed)
Med refill request:Vivelle-dot Last AEX: 09/18/21 Next AEX: not scheduled Last MMG (if hormonal med) 07/23/20 BI-RADS CAT1 neg Refill authorized: Please Advise?

## 2022-11-04 NOTE — Telephone Encounter (Signed)
Ok to refill one month supply, need AEX scheduled

## 2022-12-05 ENCOUNTER — Other Ambulatory Visit: Payer: Self-pay | Admitting: Radiology

## 2022-12-05 DIAGNOSIS — Z79890 Hormone replacement therapy (postmenopausal): Secondary | ICD-10-CM

## 2022-12-05 NOTE — Telephone Encounter (Signed)
RF request received for Progesterone 100mg .  Last AEX 09/18/21. No appointment is scheduled. RF denied.

## 2022-12-18 DIAGNOSIS — Z1231 Encounter for screening mammogram for malignant neoplasm of breast: Secondary | ICD-10-CM | POA: Diagnosis not present

## 2022-12-19 ENCOUNTER — Encounter: Payer: Self-pay | Admitting: Radiology

## 2023-01-02 DIAGNOSIS — Z0000 Encounter for general adult medical examination without abnormal findings: Secondary | ICD-10-CM | POA: Diagnosis not present

## 2023-01-02 DIAGNOSIS — E039 Hypothyroidism, unspecified: Secondary | ICD-10-CM | POA: Diagnosis not present

## 2023-01-02 DIAGNOSIS — E559 Vitamin D deficiency, unspecified: Secondary | ICD-10-CM | POA: Diagnosis not present

## 2023-01-02 DIAGNOSIS — Z79899 Other long term (current) drug therapy: Secondary | ICD-10-CM | POA: Diagnosis not present

## 2023-02-25 DIAGNOSIS — E039 Hypothyroidism, unspecified: Secondary | ICD-10-CM | POA: Diagnosis not present

## 2023-02-27 DIAGNOSIS — Z01419 Encounter for gynecological examination (general) (routine) without abnormal findings: Secondary | ICD-10-CM | POA: Diagnosis not present

## 2023-02-27 DIAGNOSIS — Z6824 Body mass index (BMI) 24.0-24.9, adult: Secondary | ICD-10-CM | POA: Diagnosis not present

## 2023-02-27 DIAGNOSIS — Z124 Encounter for screening for malignant neoplasm of cervix: Secondary | ICD-10-CM | POA: Diagnosis not present

## 2023-03-16 DIAGNOSIS — Z961 Presence of intraocular lens: Secondary | ICD-10-CM | POA: Diagnosis not present

## 2023-04-27 DIAGNOSIS — D120 Benign neoplasm of cecum: Secondary | ICD-10-CM | POA: Diagnosis not present

## 2023-04-27 DIAGNOSIS — D122 Benign neoplasm of ascending colon: Secondary | ICD-10-CM | POA: Diagnosis not present

## 2023-04-27 DIAGNOSIS — K648 Other hemorrhoids: Secondary | ICD-10-CM | POA: Diagnosis not present

## 2023-04-27 DIAGNOSIS — Z86010 Personal history of colonic polyps: Secondary | ICD-10-CM | POA: Diagnosis not present

## 2023-04-29 DIAGNOSIS — D225 Melanocytic nevi of trunk: Secondary | ICD-10-CM | POA: Diagnosis not present

## 2023-04-29 DIAGNOSIS — L821 Other seborrheic keratosis: Secondary | ICD-10-CM | POA: Diagnosis not present

## 2023-04-29 DIAGNOSIS — L648 Other androgenic alopecia: Secondary | ICD-10-CM | POA: Diagnosis not present

## 2023-04-29 DIAGNOSIS — L570 Actinic keratosis: Secondary | ICD-10-CM | POA: Diagnosis not present

## 2023-04-29 DIAGNOSIS — L814 Other melanin hyperpigmentation: Secondary | ICD-10-CM | POA: Diagnosis not present

## 2023-05-21 DIAGNOSIS — Z13820 Encounter for screening for osteoporosis: Secondary | ICD-10-CM | POA: Diagnosis not present

## 2023-10-16 DIAGNOSIS — H04123 Dry eye syndrome of bilateral lacrimal glands: Secondary | ICD-10-CM | POA: Diagnosis not present

## 2023-10-16 DIAGNOSIS — H0100B Unspecified blepharitis left eye, upper and lower eyelids: Secondary | ICD-10-CM | POA: Diagnosis not present

## 2023-10-16 DIAGNOSIS — H0100A Unspecified blepharitis right eye, upper and lower eyelids: Secondary | ICD-10-CM | POA: Diagnosis not present

## 2024-01-06 DIAGNOSIS — R002 Palpitations: Secondary | ICD-10-CM | POA: Diagnosis not present

## 2024-01-06 DIAGNOSIS — E7849 Other hyperlipidemia: Secondary | ICD-10-CM | POA: Diagnosis not present

## 2024-01-06 DIAGNOSIS — E559 Vitamin D deficiency, unspecified: Secondary | ICD-10-CM | POA: Diagnosis not present

## 2024-01-06 DIAGNOSIS — Z79899 Other long term (current) drug therapy: Secondary | ICD-10-CM | POA: Diagnosis not present

## 2024-01-06 DIAGNOSIS — L659 Nonscarring hair loss, unspecified: Secondary | ICD-10-CM | POA: Diagnosis not present

## 2024-01-06 DIAGNOSIS — E039 Hypothyroidism, unspecified: Secondary | ICD-10-CM | POA: Diagnosis not present

## 2024-01-06 DIAGNOSIS — Z0000 Encounter for general adult medical examination without abnormal findings: Secondary | ICD-10-CM | POA: Diagnosis not present

## 2024-01-28 DIAGNOSIS — Z862 Personal history of diseases of the blood and blood-forming organs and certain disorders involving the immune mechanism: Secondary | ICD-10-CM | POA: Diagnosis not present

## 2024-01-28 DIAGNOSIS — Z8349 Family history of other endocrine, nutritional and metabolic diseases: Secondary | ICD-10-CM | POA: Diagnosis not present

## 2024-01-28 DIAGNOSIS — E039 Hypothyroidism, unspecified: Secondary | ICD-10-CM | POA: Diagnosis not present

## 2024-03-08 DIAGNOSIS — Z1231 Encounter for screening mammogram for malignant neoplasm of breast: Secondary | ICD-10-CM | POA: Diagnosis not present

## 2024-03-08 DIAGNOSIS — Z124 Encounter for screening for malignant neoplasm of cervix: Secondary | ICD-10-CM | POA: Diagnosis not present

## 2024-03-08 DIAGNOSIS — L659 Nonscarring hair loss, unspecified: Secondary | ICD-10-CM | POA: Diagnosis not present

## 2024-03-08 DIAGNOSIS — Z1151 Encounter for screening for human papillomavirus (HPV): Secondary | ICD-10-CM | POA: Diagnosis not present

## 2024-03-08 DIAGNOSIS — Z6822 Body mass index (BMI) 22.0-22.9, adult: Secondary | ICD-10-CM | POA: Diagnosis not present

## 2024-03-08 DIAGNOSIS — Z01419 Encounter for gynecological examination (general) (routine) without abnormal findings: Secondary | ICD-10-CM | POA: Diagnosis not present

## 2024-03-10 DIAGNOSIS — Z8349 Family history of other endocrine, nutritional and metabolic diseases: Secondary | ICD-10-CM | POA: Diagnosis not present

## 2024-03-10 DIAGNOSIS — E039 Hypothyroidism, unspecified: Secondary | ICD-10-CM | POA: Diagnosis not present

## 2024-03-10 DIAGNOSIS — Z862 Personal history of diseases of the blood and blood-forming organs and certain disorders involving the immune mechanism: Secondary | ICD-10-CM | POA: Diagnosis not present

## 2024-03-23 DIAGNOSIS — N950 Postmenopausal bleeding: Secondary | ICD-10-CM | POA: Diagnosis not present

## 2024-04-01 DIAGNOSIS — E039 Hypothyroidism, unspecified: Secondary | ICD-10-CM | POA: Diagnosis not present

## 2024-06-26 ENCOUNTER — Emergency Department

## 2024-06-26 ENCOUNTER — Emergency Department: Admission: EM | Admit: 2024-06-26 | Discharge: 2024-06-26 | Disposition: A | Discharge status: Home / Self Care

## 2024-06-26 ENCOUNTER — Other Ambulatory Visit: Payer: Self-pay

## 2024-06-26 DIAGNOSIS — W1830XA Fall on same level, unspecified, initial encounter: Secondary | ICD-10-CM | POA: Diagnosis not present

## 2024-06-26 DIAGNOSIS — E039 Hypothyroidism, unspecified: Secondary | ICD-10-CM | POA: Diagnosis not present

## 2024-06-26 DIAGNOSIS — S42022A Displaced fracture of shaft of left clavicle, initial encounter for closed fracture: Secondary | ICD-10-CM | POA: Insufficient documentation

## 2024-06-26 DIAGNOSIS — M25512 Pain in left shoulder: Secondary | ICD-10-CM | POA: Diagnosis not present

## 2024-06-26 MED ORDER — HYDROCODONE-ACETAMINOPHEN 5-325 MG PO TABS: 1.0000 | ORAL_TABLET | Freq: Four times a day (QID) | ORAL | 0 refills | Status: AC | PRN

## 2024-06-26 MED ORDER — HYDROCODONE-ACETAMINOPHEN 5-325 MG PO TABS
1.0000 | ORAL_TABLET | Freq: Once | ORAL | Status: AC
  Administered 2024-06-26: 1 via ORAL
  Filled 2024-06-26: qty 1

## 2024-06-26 NOTE — Discharge Instructions (Addendum)
 The x-ray of your clavicle showed a fracture.  Please wear the sling and swathe at all times unless you are showering.  When you take it off to shower make sure you do not lift your arm up greater than 90 degrees.  I attached information for orthopedics for you to schedule follow-up appointment with.  Please call to schedule.  You can take 650 mg of Tylenol  and 600 mg of ibuprofen every 6 hours as needed for pain. You can use ice, heat, muscle creams and other topical pain relievers as well.  I have sent a strong pain medication called Norco to the pharmacy.  This medication can be taken every 4-6 hours as needed for severe or breakthrough pain.  This medication can cause dependency so only take if you are unable to control your pain with other medications.  It will make you sleepy so do not drive or operate heavy machinery after taking it.  Do not drink alcohol while taking this medication.  This medication can also cause constipation so please take an over-the-counter stool softener like MiraLAX or Colace while taking it.  This medication contains both hydrocodone  and acetaminophen .  Do not take Tylenol  at the same time.  You can take the Norco or Tylenol  but not both.
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
## 2024-06-26 NOTE — ED Triage Notes (Signed)
 Pt reports she had mechanical fall tonight, pt fell on left side. Pt has obvious deformity to left clavicle.

## 2024-06-26 NOTE — ED Notes (Addendum)
Sling and swathe placed onleft arm

## 2024-06-28 DIAGNOSIS — S42022A Displaced fracture of shaft of left clavicle, initial encounter for closed fracture: Secondary | ICD-10-CM | POA: Diagnosis not present

## 2024-06-28 DIAGNOSIS — M898X1 Other specified disorders of bone, shoulder: Secondary | ICD-10-CM | POA: Diagnosis not present

## 2024-06-29 ENCOUNTER — Other Ambulatory Visit: Payer: Self-pay | Admitting: Orthopedic Surgery

## 2024-07-04 ENCOUNTER — Inpatient Hospital Stay
Admission: RE | Admit: 2024-07-04 | Discharge: 2024-07-04 | Disposition: A | Discharge status: Home / Self Care | Source: Ambulatory Visit

## 2024-07-04 ENCOUNTER — Encounter: Payer: Self-pay | Admitting: Orthopedic Surgery

## 2024-07-04 DIAGNOSIS — S42022A Displaced fracture of shaft of left clavicle, initial encounter for closed fracture: Secondary | ICD-10-CM | POA: Diagnosis not present

## 2024-07-04 HISTORY — DX: Displaced fracture of shaft of left clavicle, initial encounter for closed fracture: S42.022A

## 2024-07-04 NOTE — Anesthesia Preprocedure Evaluation (Addendum)
 Anesthesia Evaluation  Patient identified by MRN, date of birth, ID band Patient awake    Reviewed: Allergy & Precautions, H&P , NPO status , Patient's Chart, lab work & pertinent test results  Airway Mallampati: I  TM Distance: >3 FB Neck ROM: Full    Dental no notable dental hx. (+) Caps No caps in front:   Pulmonary neg pulmonary ROS   Pulmonary exam normal breath sounds clear to auscultation       Cardiovascular negative cardio ROS Normal cardiovascular exam+ dysrhythmias  Rhythm:Regular Rate:Normal     Neuro/Psych  Headaches negative neurological ROS  negative psych ROS   GI/Hepatic negative GI ROS, Neg liver ROS,GERD  ,,GERD is food-related or wine-related   Endo/Other  negative endocrine ROSHypothyroidism    Renal/GU negative Renal ROS  negative genitourinary   Musculoskeletal negative musculoskeletal ROS (+)    Abdominal   Peds negative pediatric ROS (+)  Hematology negative hematology ROS (+) Blood dyscrasia, anemia   Anesthesia Other Findings Medical History  Cervical dysplasia Dysrhythmia Hypothyroidism GERD (gastroesophageal reflux disease) Headache Anemia GI bleed Closed displaced fracture of shaft of left clavicle Dry eye syndrome of bilateral lacrimal glands   Pre-op, discussed w/patient possible postop block for pain.  However, surgeon plans intra-op exparel  and bupivacaine .  If patient having significant pain postop, will  place postop superficial cervical plexus block and potentially place interscalene block if needed, as patient also reports shoulder pain. The superficial cervical plexus block ameliorates pain better in distal clavicle, while the interscalene block will help with shoulder pain.   Reproductive/Obstetrics negative OB ROS                              Anesthesia Physical Anesthesia Plan  ASA: 2  Anesthesia Plan: General   Post-op Pain Management:     Induction: Intravenous  PONV Risk Score and Plan:   Airway Management Planned: Natural Airway, LMA and Simple Face Mask  Additional Equipment:   Intra-op Plan:   Post-operative Plan: Extubation in OR  Informed Consent: I have reviewed the patients History and Physical, chart, labs and discussed the procedure including the risks, benefits and alternatives for the proposed anesthesia with the patient or authorized representative who has indicated his/her understanding and acceptance.     Dental Advisory Given  Plan Discussed with: Anesthesiologist, CRNA and Surgeon  Anesthesia Plan Comments: (Patient consented for risks of anesthesia including but not limited to:  - adverse reactions to medications - risk of airway placement if required - damage to eyes, teeth, lips or other oral mucosa - nerve damage due to positioning  - sore throat or hoarseness - Damage to heart, brain, nerves, lungs, other parts of body or loss of life  Patient voiced understanding and assent.)         Anesthesia Quick Evaluation

## 2024-07-04 NOTE — Patient Instructions (Signed)
 Your procedure is scheduled on: Tuesday 07/05/24  Report to the Registration Desk on the 1st floor of the [facility]. To find out your arrival time, please call [phone] between 1PM - 3PM on: Monday 07/04/24  If your arrival time is 6:00 am, do not arrive before that time as the [facility] entrance doors do not open until 6:00 am.  REMEMBER: Instructions that are not followed completely may result in serious medical risk, up to and including death; or upon the discretion of your surgeon and anesthesiologist your surgery may need to be rescheduled.  Do not eat food after midnight the night before surgery.  No gum chewing or hard candies.  You may however, drink CLEAR liquids up to 2 hours before you are scheduled to arrive for your surgery. Do not drink anything within 2 hours of your scheduled arrival time.  Clear liquids include: - water  - apple juice without pulp - gatorade (not RED colors) - black coffee or tea (Do NOT add milk or creamers to the coffee or tea) Do NOT drink anything that is not on this list.  In addition, your doctor has ordered for you to drink the provided:  Ensure Pre-Surgery Clear Carbohydrate Drink  Gatorade G2 Drinking this carbohydrate drink up to two hours before surgery helps to reduce insulin  resistance and improve patient outcomes. Please complete drinking 2 hours before scheduled arrival time.  One week prior to surgery: Stop Anti-inflammatories (NSAIDS) such as Advil, Aleve, Ibuprofen, Motrin, Naproxen, Naprosyn and Aspirin based products such as Excedrin, Goody's Powder, BC Powder. Stop ANY OVER THE COUNTER supplements until after surgery.  You may however, continue to take Tylenol  if needed for pain up until the day of surgery.  Stop tirzepatide 7.5 MG/0.5ML injection vial 7 days prior to surgery.  Continue taking all of your other prescription medications up until the day of surgery.  ON THE DAY OF SURGERY ONLY TAKE THESE MEDICATIONS  WITH SIPS OF WATER:  sertraline (ZOLOFT) 100 MG  levothyroxine (SYNTHROID) 200 MCG    No Alcohol for 24 hours before or after surgery.  No Smoking including e-cigarettes for 24 hours before surgery.  No chewable tobacco products for at least 6 hours before surgery.  No nicotine patches on the day of surgery.  Do not use any recreational drugs for at least a week (preferably 2 weeks) before your surgery.  Please be advised that the combination of cocaine and anesthesia may have negative outcomes, up to and including death. If you test positive for cocaine, your surgery will be cancelled.  On the morning of surgery brush your teeth with toothpaste and water, you may rinse your mouth with mouthwash if you wish. Do not swallow any toothpaste or mouthwash.  Use CHG Soap or wipes as directed on instruction sheet.  Do not wear jewelry, make-up, hairpins, clips or nail polish.  For welded (permanent) jewelry: bracelets, anklets, waist bands, etc.  Please have this removed prior to surgery.  If it is not removed, there is a chance that hospital personnel will need to cut it off on the day of surgery.  Do not wear lotions, powders, or perfumes.   Do not shave body hair from the neck down 48 hours before surgery.  Contact lenses, hearing aids and dentures may not be worn into surgery.  Do not bring valuables to the hospital. [facility] is not responsible for any missing/lost belongings or valuables.   Bring your C-PAP to the hospital in case  you may have to spend the night.   Notify your doctor if there is any change in your medical condition (cold, fever, infection).  Wear comfortable clothing (specific to your surgery type) to the hospital.  After surgery, you can help prevent lung complications by doing breathing exercises.  Take deep breaths and cough every 1-2 hours. Your doctor may order a device called an Incentive Spirometer to help you take deep breaths. When coughing or  sneezing, hold a pillow firmly against your incision with both hands. This is called "splinting." Doing this helps protect your incision. It also decreases belly discomfort.  If you are being admitted to the hospital overnight, leave your suitcase in the car. After surgery it may be brought to your room.  In case of increased patient census, it may be necessary for you, the patient, to continue your postoperative care in the Same Day Surgery department.  If you are being discharged the day of surgery, you will not be allowed to drive home. You will need a responsible individual to drive you home and stay with you for 24 hours after surgery.   If you are taking public transportation, you will need to have a responsible individual with you.  Please call the Pre-admissions Testing Dept. at [phone] if you have any questions about these instructions.  Surgery Visitation Policy:  Patients having surgery or a procedure may have two visitors.  Children under the age of 38 must have an adult with them who is not the patient.  Inpatient Visitation:    Visiting hours are 7 a.m. to 8 p.m. Up to four visitors are allowed at one time in a patient room. The visitors may rotate out with other people during the day.  One visitor age 45 or older may stay with the patient overnight and must be in the room by 8 p.m.   Merchandiser, Retail to address health-related social needs:  [email or web address]                                                                                                           Preparing for Surgery with CHLORHEXIDINE GLUCONATE (CHG) Soap  Chlorhexidine Gluconate (CHG) Soap  o An antiseptic cleaner that kills germs and bonds with the skin to continue killing germs even after washing  o Used for showering the night before surgery and morning of surgery  Before surgery, you can play an important role by reducing the number of germs on your skin.   CHG (Chlorhexidine gluconate) soap is an antiseptic cleanser which kills germs and bonds with the skin to continue killing germs even after washing.  Please do not use if you have an allergy to CHG or antibacterial soaps. If your skin becomes reddened/irritated stop using the CHG.  1. Shower the NIGHT BEFORE SURGERY with CHG soap.  2. If you choose to wash your hair, wash your hair first as usual with your normal shampoo.  3. After shampooing, rinse your hair and body thoroughly to remove the shampoo.  4. Use CHG as  you would any other liquid soap. You can apply CHG directly to the skin and wash gently with a clean washcloth.  5. Apply the CHG soap to your body only from the neck down. Do not use on open wounds or open sores. Avoid contact with your eyes, ears, mouth, and genitals (private parts). Wash face and genitals (private parts) with your normal soap.  6. Wash thoroughly, paying special attention to the area where your surgery will be performed.  7. Thoroughly rinse your body with warm water.  8. Do not shower/wash with your normal soap after using and rinsing off the CHG soap.  9. Do not use lotions, oils, etc., after showering with CHG.  10. Pat yourself dry with a clean towel.  11. Wear clean pajamas to bed the night before surgery.  12. Place clean sheets on your bed the night of your shower and do not sleep with pets.  13. Do not apply any deodorants/lotions/powders.  14. Please wear clean clothes to the hospital.  15. Remember to brush your teeth with your regular toothpaste.

## 2024-07-04 NOTE — Progress Notes (Signed)
 TC to patient for PAT, said she just saw Dr. Tobie at his office and was told that surgery was moved to [place] for Friday 07/08/24.

## 2024-07-08 ENCOUNTER — Ambulatory Visit: Payer: Self-pay | Admitting: Urgent Care

## 2024-07-08 ENCOUNTER — Ambulatory Visit

## 2024-07-08 ENCOUNTER — Encounter: Payer: Self-pay | Admitting: Orthopedic Surgery

## 2024-07-08 ENCOUNTER — Other Ambulatory Visit: Payer: Self-pay

## 2024-07-08 ENCOUNTER — Ambulatory Visit: Payer: Self-pay | Admitting: Anesthesiology

## 2024-07-08 ENCOUNTER — Encounter: Admission: RE | Disposition: A | Payer: Self-pay | Source: Home / Self Care | Attending: Orthopedic Surgery

## 2024-07-08 ENCOUNTER — Ambulatory Visit
Admission: RE | Admit: 2024-07-08 | Discharge: 2024-07-08 | Disposition: A | Attending: Orthopedic Surgery | Admitting: Orthopedic Surgery

## 2024-07-08 DIAGNOSIS — R519 Headache, unspecified: Secondary | ICD-10-CM | POA: Insufficient documentation

## 2024-07-08 DIAGNOSIS — S42022A Displaced fracture of shaft of left clavicle, initial encounter for closed fracture: Secondary | ICD-10-CM | POA: Insufficient documentation

## 2024-07-08 DIAGNOSIS — D649 Anemia, unspecified: Secondary | ICD-10-CM | POA: Diagnosis not present

## 2024-07-08 DIAGNOSIS — E039 Hypothyroidism, unspecified: Secondary | ICD-10-CM | POA: Insufficient documentation

## 2024-07-08 DIAGNOSIS — Z79890 Hormone replacement therapy (postmenopausal): Secondary | ICD-10-CM | POA: Diagnosis not present

## 2024-07-08 DIAGNOSIS — Z79899 Other long term (current) drug therapy: Secondary | ICD-10-CM | POA: Diagnosis not present

## 2024-07-08 DIAGNOSIS — I499 Cardiac arrhythmia, unspecified: Secondary | ICD-10-CM | POA: Diagnosis not present

## 2024-07-08 DIAGNOSIS — G8918 Other acute postprocedural pain: Secondary | ICD-10-CM | POA: Diagnosis not present

## 2024-07-08 DIAGNOSIS — K219 Gastro-esophageal reflux disease without esophagitis: Secondary | ICD-10-CM | POA: Diagnosis not present

## 2024-07-08 DIAGNOSIS — M898X1 Other specified disorders of bone, shoulder: Secondary | ICD-10-CM | POA: Diagnosis not present

## 2024-07-08 DIAGNOSIS — Z419 Encounter for procedure for purposes other than remedying health state, unspecified: Secondary | ICD-10-CM

## 2024-07-08 HISTORY — DX: Dry eye syndrome of bilateral lacrimal glands: H04.123

## 2024-07-08 HISTORY — PX: ORIF CLAVICULAR FRACTURE: SHX5055

## 2024-07-08 SURGERY — OPEN REDUCTION INTERNAL FIXATION (ORIF) CLAVICULAR FRACTURE
Anesthesia: General | Site: Shoulder | Laterality: Left

## 2024-07-08 MED ORDER — MIDAZOLAM HCL 5 MG/5ML IJ SOLN
INTRAMUSCULAR | Status: DC | PRN
  Administered 2024-07-08: 2 mg via INTRAVENOUS

## 2024-07-08 MED ORDER — PHENYLEPHRINE 80 MCG/ML (10ML) SYRINGE FOR IV PUSH (FOR BLOOD PRESSURE SUPPORT)
PREFILLED_SYRINGE | INTRAVENOUS | Status: AC
  Filled 2024-07-08: qty 10

## 2024-07-08 MED ORDER — BUPIVACAINE HCL (PF) 0.5 % IJ SOLN
INTRAMUSCULAR | Status: DC | PRN
  Administered 2024-07-08: 6 mL

## 2024-07-08 MED ORDER — BUPIVACAINE LIPOSOME 1.3 % IJ SUSP
INTRAMUSCULAR | Status: AC
  Filled 2024-07-08: qty 10

## 2024-07-08 MED ORDER — BUPIVACAINE LIPOSOME 1.3 % IJ SUSP
INTRAMUSCULAR | Status: DC | PRN
  Administered 2024-07-08: 6 mL

## 2024-07-08 MED ORDER — PROPOFOL 10 MG/ML IV BOLUS
INTRAVENOUS | Status: DC | PRN
  Administered 2024-07-08: 170 mg via INTRAVENOUS

## 2024-07-08 MED ORDER — LIDOCAINE HCL (CARDIAC) PF 100 MG/5ML IV SOSY
PREFILLED_SYRINGE | INTRAVENOUS | Status: DC | PRN
  Administered 2024-07-08: 100 mg via INTRATRACHEAL

## 2024-07-08 MED ORDER — DEXAMETHASONE SODIUM PHOSPHATE 4 MG/ML IJ SOLN
INTRAMUSCULAR | Status: AC
  Filled 2024-07-08: qty 1

## 2024-07-08 MED ORDER — SEVOFLURANE IN SOLN
RESPIRATORY_TRACT | Status: AC
  Filled 2024-07-08: qty 250

## 2024-07-08 MED ORDER — FENTANYL CITRATE (PF) 100 MCG/2ML IJ SOLN
INTRAMUSCULAR | Status: DC | PRN
  Administered 2024-07-08 (×4): 50 ug via INTRAVENOUS

## 2024-07-08 MED ORDER — BUPIVACAINE HCL (PF) 0.5 % IJ SOLN
INTRAMUSCULAR | Status: AC
  Filled 2024-07-08: qty 30

## 2024-07-08 MED ORDER — EPHEDRINE SULFATE (PRESSORS) 25 MG/5ML IV SOSY
PREFILLED_SYRINGE | INTRAVENOUS | Status: DC | PRN
  Administered 2024-07-08 (×6): 5 mg via INTRAVENOUS

## 2024-07-08 MED ORDER — OXYCODONE HCL 5 MG PO TABS
ORAL_TABLET | ORAL | Status: AC
  Filled 2024-07-08: qty 2

## 2024-07-08 MED ORDER — DEXAMETHASONE SOD PHOSPHATE PF 10 MG/ML IJ SOLN
INTRAMUSCULAR | Status: DC | PRN
  Administered 2024-07-08: 10 mg via PERINEURAL

## 2024-07-08 MED ORDER — FENTANYL CITRATE (PF) 100 MCG/2ML IJ SOLN
INTRAMUSCULAR | Status: AC
  Filled 2024-07-08: qty 2

## 2024-07-08 MED ORDER — PHENYLEPHRINE HCL (PRESSORS) 10 MG/ML IV SOLN
INTRAVENOUS | Status: DC | PRN
  Administered 2024-07-08 (×5): 80 ug via INTRAVENOUS

## 2024-07-08 MED ORDER — FENTANYL CITRATE (PF) 100 MCG/2ML IJ SOLN
100.0000 ug | Freq: Once | INTRAMUSCULAR | Status: AC
  Administered 2024-07-08: 50 ug via INTRAVENOUS

## 2024-07-08 MED ORDER — MIDAZOLAM HCL 2 MG/2ML IJ SOLN
INTRAMUSCULAR | Status: AC
  Filled 2024-07-08: qty 2

## 2024-07-08 MED ORDER — LACTATED RINGERS IV SOLN: INTRAVENOUS | Status: DC

## 2024-07-08 MED ORDER — ARTIFICIAL TEARS OPHTHALMIC OINT
TOPICAL_OINTMENT | OPHTHALMIC | Status: AC
  Filled 2024-07-08: qty 3.5

## 2024-07-08 MED ORDER — ONDANSETRON HCL 4 MG/2ML IJ SOLN
INTRAMUSCULAR | Status: AC
  Filled 2024-07-08: qty 2

## 2024-07-08 MED ORDER — ONDANSETRON 4 MG PO TBDP: 4.0000 mg | ORAL_TABLET | Freq: Three times a day (TID) | ORAL | 0 refills | Status: AC | PRN

## 2024-07-08 MED ORDER — MIDAZOLAM HCL (PF) 2 MG/2ML IJ SOLN
2.0000 mg | Freq: Once | INTRAMUSCULAR | Status: AC
  Administered 2024-07-08: 2 mg via INTRAVENOUS

## 2024-07-08 MED ORDER — CEFAZOLIN SODIUM-DEXTROSE 2-4 GM/100ML-% IV SOLN
2.0000 g | INTRAVENOUS | Status: AC
  Administered 2024-07-08: 2 g via INTRAVENOUS

## 2024-07-08 MED ORDER — LIDOCAINE HCL (PF) 2 % IJ SOLN
INTRAMUSCULAR | Status: AC
  Filled 2024-07-08: qty 5

## 2024-07-08 MED ORDER — ACETAMINOPHEN 500 MG PO TABS: 1000.0000 mg | ORAL_TABLET | Freq: Three times a day (TID) | ORAL | 2 refills | Status: AC

## 2024-07-08 MED ORDER — CEFAZOLIN SODIUM-DEXTROSE 2-3 GM-%(50ML) IV SOLR
INTRAVENOUS | Status: AC
  Filled 2024-07-08: qty 50

## 2024-07-08 MED ORDER — ASPIRIN 325 MG PO TBEC: 325.0000 mg | DELAYED_RELEASE_TABLET | Freq: Every day | ORAL | 0 refills | Status: AC

## 2024-07-08 MED ORDER — ARTIFICIAL TEARS OPHTHALMIC OINT
TOPICAL_OINTMENT | OPHTHALMIC | Status: DC | PRN
  Administered 2024-07-08: 1 via OPHTHALMIC

## 2024-07-08 MED ORDER — EPHEDRINE 5 MG/ML INJ
INTRAVENOUS | Status: AC
  Filled 2024-07-08: qty 5

## 2024-07-08 MED ORDER — PROPOFOL 10 MG/ML IV BOLUS
INTRAVENOUS | Status: AC
  Filled 2024-07-08: qty 20

## 2024-07-08 MED ORDER — BUPIVACAINE HCL (PF) 0.5 % IJ SOLN
INTRAMUSCULAR | Status: DC | PRN
  Administered 2024-07-08: 20 mL via PERINEURAL

## 2024-07-08 MED ORDER — OXYCODONE HCL 5 MG PO TABS
10.0000 mg | ORAL_TABLET | Freq: Once | ORAL | Status: AC
  Administered 2024-07-08: 10 mg via ORAL

## 2024-07-08 MED ORDER — DEXAMETHASONE SODIUM PHOSPHATE 4 MG/ML IJ SOLN
INTRAMUSCULAR | Status: DC | PRN
  Administered 2024-07-08: 4 mg via INTRAVENOUS

## 2024-07-08 SURGICAL SUPPLY — 37 items
BIT DRILL 135X2XAO FIT SCL (BIT) IMPLANT
BIT DRILL 2.6X VARIAX 2 (BIT) IMPLANT
BIT DRILL VARIAX 2.4X122 OVER (BIT) IMPLANT
CHLORAPREP W/TINT 26 (MISCELLANEOUS) IMPLANT
COOLER ICEMAN CLASSIC (MISCELLANEOUS) ×1 IMPLANT
COVER LIGHT HANDLE UNIVERSAL (MISCELLANEOUS) ×2 IMPLANT
DERMABOND ADVANCED .7 DNX12 (GAUZE/BANDAGES/DRESSINGS) ×1 IMPLANT
DRAPE C-ARM XRAY 36X54 (DRAPES) ×2 IMPLANT
DRAPE INCISE IOBAN 66X45 STRL (DRAPES) ×1 IMPLANT
DRAPE U-SHAPE 48X52 POLY STRL (PACKS) ×1 IMPLANT
DRSG OPSITE POSTOP 4X8 (GAUZE/BANDAGES/DRESSINGS) ×1 IMPLANT
ELECTRODE REM PT RTRN 9FT ADLT (ELECTROSURGICAL) ×1 IMPLANT
GAUZE SPONGE 4X4 12PLY STRL (GAUZE/BANDAGES/DRESSINGS) ×1 IMPLANT
GLOVE SURG SS PI 7.5 STRL IVOR (GLOVE) IMPLANT
GLOVE SURG SYN 8.0 PF PI (GLOVE) IMPLANT
GOWN STRL REIN 2XL XLG LVL4 (GOWN DISPOSABLE) ×1 IMPLANT
GOWN STRL REUS W/ TWL LRG LVL3 (GOWN DISPOSABLE) ×1 IMPLANT
KIT TURNOVER KIT A (KITS) ×1 IMPLANT
KWIRE FX150X1.25XNS LF SS (WIRE) IMPLANT
KWIRE OLIVE STOP (WIRE) IMPLANT
MANIFOLD NEPTUNE II (INSTRUMENTS) IMPLANT
MASK FACE SPIDER DISP (MASK) ×1 IMPLANT
NS IRRIG 500ML POUR BTL (IV SOLUTION) ×2 IMPLANT
PACK ARTHROSCOPY SHOULDER (MISCELLANEOUS) ×1 IMPLANT
PAD COLD UNI WRAP-ON (PAD) IMPLANT
PLATE MIDSHAFT 7H SUPERIOR LT (Plate) IMPLANT
SCREW BN T10 FT 14X3.5XSTRDR (Screw) IMPLANT
SCREW BONE 18X3.5 (Screw) IMPLANT
SCREW BONE 3.5X16MM (Screw) IMPLANT
SCREW BONE THRD T10 3.5X12 (Screw) IMPLANT
SCREW CORT NLOCK FT 2.4X12 (Screw) IMPLANT
SCREW CORT NLOCK FT 2.4X16 (Screw) IMPLANT
SLING ULTRA II M (MISCELLANEOUS) IMPLANT
SPONGE T-LAP 18X18 ~~LOC~~+RFID (SPONGE) IMPLANT
SUT VIC AB 0 CT1 36 (SUTURE) ×1 IMPLANT
SUT VIC AB 2-0 SH 27XBRD (SUTURE) ×1 IMPLANT
SUTURE MNCRL 4-0 27XMF (SUTURE) ×1 IMPLANT

## 2024-07-08 NOTE — Anesthesia Procedure Notes (Signed)
 Anesthesia Regional Block: Interscalene brachial plexus block   Pre-Anesthetic Checklist: , timeout performed,  Correct Patient, Correct Site, Correct Laterality,  Correct Procedure, Correct Position, site marked,  Risks and benefits discussed,  Surgical consent,  Pre-op evaluation,  At surgeon's request and post-op pain management  Laterality: Upper  Prep: chloraprep       Needles:  Injection technique: Single-shot  Needle Type: Stimiplex     Needle Length: 9cm  Needle Gauge: 22     Additional Needles:   Procedures:,,,, ultrasound used (permanent image in chart),,    Narrative:  Start time: 07/08/2024 10:33 AM End time: 07/08/2024 10:52 AM Injection made incrementally with aspirations every 5 mL.  Performed by: Personally  Anesthesiologist: Ola Donny BROCKS, MD  Additional Notes: Patient consented for risk and benefits of nerve block including but not limited to nerve damage, Horner's syndrome, failed block, bleeding and infection.  Patient voiced understanding.  Functioning IV was confirmed and monitors were applied.  Timeout done prior to procedure and prior to any sedation being given to the patient.  Patient confirmed procedure site prior to any sedation given to the patient.  A 50mm 22ga Stimuplex needle was used. Sterile prep,hand hygiene and sterile gloves were used.  Minimal sedation used for procedure.  No paresthesia endorsed by patient during the procedure.  Negative aspiration and negative test dose prior to incremental administration of local anesthetic. The patient tolerated the procedure well with no immediate complications. Postop patient was having significant left clavicle, neck and shoulder pain, and patient requested block, had interscalene and superficial cervical plexus block. Tolerated block well, and was smiling and had pain relief within 5-8 minutes of end of block placement.

## 2024-07-08 NOTE — Discharge Instructions (Addendum)
 Brittany HILARIO Blanch, MD  [facility]  Phone: [phone]  Fax: [phone]   Discharge Instructions after ORIF Clavicle    1. Activity/Sling: You are to be non-weight bearing on operative extremity. A sling/shoulder immobilizer has been provided for you. Only remove the sling to perform elbow, wrist, and hand RoM exercises and hygiene/dressing. Active reaching and lifting are not permitted. You will be given further instructions on sling use at your first physical therapy visit and postoperative visit with Dr. Blanch.   2. Dressings: Dressing may be removed at 1st physical therapy visit (~3-4 days after surgery). Afterwards, you may either leave open to air (if no drainage) or cover with dry, sterile dressing. If you have steri-strips on your wound, please do not remove them. They will fall off on their own. You may shower 5 days after surgery. Please pat incision dry. Do not rub or place any shear forces across incision. If there is drainage or any opening of incision after 5 days, please notify our offices immediately.    3. Driving:  Plan on not driving for six weeks. Please note that you are advised NOT to drive while taking narcotic pain medications as you may be impaired and unsafe to drive.   4. Medications:  - You may take previously prescribed Norco and/or Tramadol for pain if needed beyond tylenol  and aspirin  (take as instructed below). - A prescription for anti-nausea medication will be provided in case the narcotic medicine causes nausea - take 1 tablet every 6 hours only if nauseated.  - Take enteric coated aspirin  325 mg once daily for 2 weeks to prevent blood clots. Do not take aspirin  if you have an aspirin  sensitivity/allergy or asthma or are on an anticoagulant (blood thinner) already. If so, then your home anticoagulant will be resume and managed - do not take aspirin . -Take tylenol  1000mg  (2 Extra strength or 3 regular strength tablets) every 8 hours for pain. This will reduce  the amount of narcotic medication needed. May stop tylenol  when you are having minimal pain. - Take a stool softener (Colace, Dulcolax or Senakot) if you are using narcotic pain medications to help with constipation that is associated with narcotic use.   If you are taking prescription medication for anxiety, depression, insomnia, muscle spasm, chronic pain, or for attention deficit disorder you are advised that you are at a higher risk of adverse effects with use of narcotics post-op, including narcotic addiction/dependence, depressed breathing, death. If you use non-prescribed substances: alcohol, marijuana, cocaine, heroin, methamphetamines, etc., you are at a higher risk of adverse effects with use of narcotics post-op, including narcotic addiction/dependence, depressed breathing, death. You are advised that taking > 50 morphine milligram equivalents (MME) of narcotic pain medication per day results in twice the risk of overdose or death. For your prescription provided: oxycodone  5 mg - taking more than 6 tablets per day after the first few days of surgery.   5. Physical Therapy: 1-2 times per week for ~12 weeks. Therapy typically starts on post operative Day 3 or 4. You have been provided an order for physical therapy. The therapist will provide home exercises. Please contact our offices if this appointment has not been scheduled.    6. Work: May do light duty/desk job in approximately 1-2 weeks when off of narcotics, pain is well-controlled, and swelling has decreased if able to function with one arm in sling. Full work may take 6 weeks if light motions and function of both arms is required. Lifting  jobs may require 12 weeks.   7. Post-Op Appointments: Your first post-op appointment will be in approximately 2 weeks time.    If you find that they have not been scheduled please call the Orthopaedic Appointment front desk at  [phone].                                         POLAR CARE INFORMATION  [email or web address]  How to use [employer] Polar Care [facility]?  YouTube   [email or web address]  OPERATING INSTRUCTIONS  Start the product With dry hands, connect the transformer to the electrical connection located on the top of the cooler. Next, plug the transformer into an appropriate electrical outlet. The unit will automatically start running at this point.  To stop the pump, disconnect electrical power.  Unplug to stop the product when not in use. Unplugging the Polar Care unit turns it off. Always unplug immediately after use. Never leave it plugged in while unattended. Remove pad.    FIRST ADD WATER TO FILL LINE, THEN ICE---Replace ice when existing ice is almost melted  1 Discuss Treatment with your Licensed Health Care Practitioner and Use Only as Prescribed 2 Apply Insulation Barrier & Cold Therapy Pad 3 Check for Moisture 4 Inspect Skin Regularly  Tips and Trouble Shooting Usage Tips 1. Use cubed or chunked ice for optimal performance. 2. It is recommended to drain the Pad between uses. To drain the pad, hold the Pad upright with the hose pointed toward the ground. Depress the black plunger and allow water to drain out. 3. You may disconnect the Pad from the unit without removing the pad from the affected area by depressing the silver tabs on the hose coupling and gently pulling the hoses apart. The Pad and unit will seal itself and will not leak. Note: Some dripping during release is normal. 4. DO NOT RUN PUMP WITHOUT WATER! The pump in this unit is designed to run with water. Running the unit without water will cause permanent damage to the pump. 5. Unplug unit before removing lid.  TROUBLESHOOTING GUIDE Pump not running, Water not flowing to the pad, Pad is not getting cold 1. Make sure the transformer is plugged into the  wall outlet. 2. Confirm that the ice and water are filled to the indicated levels. 3. Make sure there are no kinks in the pad. 4. Gently pull on the blue tube to make sure the tube/pad junction is straight. 5. Remove the pad from the treatment site and ll it while the pad is lying at; then reapply. 6. Confirm that the pad couplings are securely attached to the unit. Listen for the double clicks (Figure 1) to confirm the pad couplings are securely attached.  Leaks    Note: Some condensation on the lines, controller, and pads is unavoidable, especially in warmer climates. 1. If using a [employer] Polar Care Cold Therapy unit with a detachable Cold Therapy Pad, and a leak exists (other than condensation on the lines) disconnect the pad couplings. Make sure the silver tabs on the couplings are depressed before reconnecting the pad to the pump hose; then confirm both sides of the coupling are properly clicked in. 2. If the coupling continues to leak or a leak is detected in the pad itself, stop using it and call [employer] Customer Care at [phone].  Cleaning After use, empty and dry the unit  with a soft cloth. Warm water and mild detergent may be used occasionally to clean the pump and tubes.  WARNING: The Polar Care Cube can be cold enough to cause serious injury, including full skin necrosis. Follow these Operating Instructions, and carefully read the Product Insert (see pouch on side of unit) and the Cold Therapy Pad Fitting Instructions (provided with each Cold Therapy Pad) prior to use.       PERIPHERAL NERVE BLOCK PATIENT INFORMATION  Your surgeon has requested a peripheral nerve block for your surgery. This anesthetic technique provides excellent post-operative pain relief for you in a safe and effective manner. It will also help reduce the risk of nausea and vomiting and allow earlier discharge from the hospital.   The block is performed under sedation with ultrasound guidance prior to your  procedure. Due to the sedation, your may or may not remember the block experience. The nerve block will begin to take effect anywhere from 5 to 30 minutes after being administered. You will be transported to the operating room from your surgery after the block is completed.   At the end of surgery, when the anesthesia wears off, you will notice a few things. Your may not be able to move or feel the part of your body targeted by the nerve block. These are normal experiences, and they will disappear as the block wears off.  If you had an interscalene nerve block performed (which is common for shoulder surgery), your voice can be very hoarse and you may feel that you are not able to take as deep a breath as you did before surgery. Some patients may also notice a droopy eyelid on the affected side. These symptoms will resolve once the block wears off.  Pain control: The nerve block technique used is a single injection that can last anywhere from 1-3 days. The duration of the numbness can vary between individuals. After leaving the hospital, it is important that you begin to take your prescribed pain medication when you start to sense the nerve block wearing off. This will help you avoid unpleasant pain at the time the nerve block wears off, which can sometimes be in the middle of the night. The block will only cover pain in the areas targeted by the nerve block so if you experience surgical pain outside of that area, please take your prescribed pain medication. Management of the "numb area": After a nerve block, you cannot feel pain, pressure, or temperature in the affected area so there is an increased risk for injury. You should take extra care to protect the affected areas until sensation and movement returns. Please take caution to not come in contact with extremely hot or cold items because you will not be able to sense or protect yourself form the extremes of temperature.  You may experience some persistent  numbness after the procedure by most neurological deficits resolve over time and the incidence of serious long term neurological complications attributable to peripheral nerve blocks are relatively uncommon.

## 2024-07-08 NOTE — Transfer of Care (Signed)
 Immediate Anesthesia Transfer of Care Note  Patient: Kloe Bucio  Procedure(s) Performed: OPEN REDUCTION INTERNAL FIXATION (ORIF) CLAVICULAR FRACTURE (Left: Shoulder)  Patient Location: PACU  Anesthesia Type: General  Level of Consciousness: awake, alert  and patient cooperative  Airway and Oxygen Therapy: Patient Spontanous Breathing and Patient connected to supplemental oxygen  Post-op Assessment: Post-op Vital signs reviewed, Patient's Cardiovascular Status Stable, Respiratory Function Stable, Patent Airway and No signs of Nausea or vomiting  Post-op Vital Signs: Reviewed and stable  Complications: No notable events documented.

## 2024-07-08 NOTE — H&P (Signed)
 Paper H&P to be scanned into permanent record. H&P reviewed. No significant changes noted.

## 2024-07-08 NOTE — Anesthesia Postprocedure Evaluation (Signed)
 Anesthesia Post Note  Patient: Brittany Moss  Procedure(s) Performed: OPEN REDUCTION INTERNAL FIXATION (ORIF) CLAVICULAR FRACTURE (Left: Shoulder)  Patient location during evaluation: PACU Anesthesia Type: General Level of consciousness: awake and alert Pain management: pain level controlled Vital Signs Assessment: post-procedure vital signs reviewed and stable Respiratory status: spontaneous breathing, nonlabored ventilation, respiratory function stable and patient connected to nasal cannula oxygen Cardiovascular status: blood pressure returned to baseline and stable Postop Assessment: no apparent nausea or vomiting Anesthetic complications: no   No notable events documented.   Last Vitals:  Vitals:   07/08/24 1105 07/08/24 1115  BP: 115/70 119/76  Pulse: 86 87  Resp: 19 19  Temp:  36.8 C  SpO2: 97% 93%    Last Pain:  Vitals:   07/08/24 1115  TempSrc:   PainSc: 4                  Correna Meacham C Aveion Nguyen
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
## 2024-07-08 NOTE — Progress Notes (Signed)
 Assisted [facility] ANMD with left, supraclavicular, ultrasound guided block. Side rails up, monitors on throughout procedure. See vital signs in flow sheet. Tolerated Procedure well.

## 2024-07-08 NOTE — Anesthesia Procedure Notes (Signed)
 Procedure Name: LMA Insertion Date/Time: 07/08/2024 8:01 AM  Performed by: Marcanthony Sleight, CRNAPre-anesthesia Checklist: Patient identified, Emergency Drugs available, Suction available, Patient being monitored and Timeout performed Patient Re-evaluated:Patient Re-evaluated prior to induction Oxygen Delivery Method: Circle system utilized Preoxygenation: Pre-oxygenation with 100% oxygen Induction Type: IV induction LMA: LMA inserted LMA Size: 4.0 Tube type: Oral Number of attempts: 1 Placement Confirmation: positive ETCO2, CO2 detector and breath sounds checked- equal and bilateral Tube secured with: Tape Dental Injury: Teeth and Oropharynx as per pre-operative assessment

## 2024-07-13 ENCOUNTER — Encounter: Payer: Self-pay | Admitting: Orthopedic Surgery

## 2024-07-19 DIAGNOSIS — M898X1 Other specified disorders of bone, shoulder: Secondary | ICD-10-CM | POA: Diagnosis not present

## 2024-07-20 DIAGNOSIS — S42022A Displaced fracture of shaft of left clavicle, initial encounter for closed fracture: Secondary | ICD-10-CM | POA: Diagnosis not present

## 2024-08-03 DIAGNOSIS — M898X1 Other specified disorders of bone, shoulder: Secondary | ICD-10-CM | POA: Diagnosis not present
